# Patient Record
Sex: Female | Born: 1985 | Race: Black or African American | Hispanic: No | Marital: Married | Smoking: Never smoker
Health system: Southern US, Community
[De-identification: ages and names within clinical notes are randomized; demographics above are authoritative.]

---

## 2020-11-06 ENCOUNTER — Encounter: Payer: Self-pay | Admitting: *Deleted

## 2020-11-06 ENCOUNTER — Emergency Department
Admission: EM | Admit: 2020-11-06 | Discharge: 2020-11-07 | Disposition: A | Discharge status: Home / Self Care | Payer: 59 | Attending: Emergency Medicine | Admitting: Emergency Medicine

## 2020-11-06 ENCOUNTER — Other Ambulatory Visit: Payer: Self-pay

## 2020-11-06 ENCOUNTER — Emergency Department: Payer: 59

## 2020-11-06 DIAGNOSIS — R58 Hemorrhage, not elsewhere classified: Secondary | ICD-10-CM

## 2020-11-06 DIAGNOSIS — Z3A12 12 weeks gestation of pregnancy: Secondary | ICD-10-CM | POA: Diagnosis not present

## 2020-11-06 DIAGNOSIS — O208 Other hemorrhage in early pregnancy: Secondary | ICD-10-CM | POA: Diagnosis present

## 2020-11-06 DIAGNOSIS — N9489 Other specified conditions associated with female genital organs and menstrual cycle: Secondary | ICD-10-CM | POA: Insufficient documentation

## 2020-11-06 DIAGNOSIS — R8271 Bacteriuria: Secondary | ICD-10-CM | POA: Insufficient documentation

## 2020-11-06 DIAGNOSIS — O209 Hemorrhage in early pregnancy, unspecified: Secondary | ICD-10-CM

## 2020-11-06 LAB — CBC
HCT: 34.3 % — ABNORMAL LOW (ref 36.0–46.0)
Hemoglobin: 11.6 g/dL — ABNORMAL LOW (ref 12.0–15.0)
MCH: 29.2 pg (ref 26.0–34.0)
MCHC: 33.8 g/dL (ref 30.0–36.0)
MCV: 86.4 fL (ref 80.0–100.0)
Platelets: 268 10*3/uL (ref 150–400)
RBC: 3.97 MIL/uL (ref 3.87–5.11)
RDW: 13 % (ref 11.5–15.5)
WBC: 5.6 10*3/uL (ref 4.0–10.5)
nRBC: 0 % (ref 0.0–0.2)

## 2020-11-06 LAB — URINALYSIS, COMPLETE (UACMP) WITH MICROSCOPIC
Bilirubin Urine: NEGATIVE
Glucose, UA: NEGATIVE mg/dL
Ketones, ur: NEGATIVE mg/dL
Nitrite: NEGATIVE
Protein, ur: NEGATIVE mg/dL
Specific Gravity, Urine: 1.006 (ref 1.005–1.030)
pH: 7 (ref 5.0–8.0)

## 2020-11-06 LAB — ABO/RH: ABO/RH(D): O POS

## 2020-11-06 LAB — HCG, QUANTITATIVE, PREGNANCY: hCG, Beta Chain, Quant, S: 97347 m[IU]/mL — ABNORMAL HIGH (ref <5)

## 2020-11-06 NOTE — ED Provider Notes (Signed)
[facility] Provider Note  ____________________________________________   Event Date/Time   First MD Initiated Contact with Patient 11/06/20 2325     (approximate)  I have reviewed the triage vital signs and the nursing notes.   HISTORY  Chief Complaint Vaginal Bleeding   HPI Analyssa [facility] is a 35 y.o. female G4P3 without other significant past medical history approximately [redacted] weeks pregnant by last LMP on 5/20 who presents for assessment of an episode of painless vaginal bleeding she had today.  Patient states he was using the restroom and wiped her vagina and noticed little blood on the tissue but then the next time she wiped there was no blood.  She has not been passing any clots or fetal tissue.  She denies any abdominal pain, back pain, fevers, diarrhea, constipation recent injuries or falls.  No headache, earache, sore throat, chest pain, cough, shortness of breath or recent injuries.  No other acute concerns at this time.  She states she is not have establish care with her OB/GYN has appointment in a couple weeks.  She did take a home pregnancy test that was positive and confirmed by her PCP.         History reviewed. No pertinent past medical history.  There are no problems to display for this patient.   History reviewed. No pertinent surgical history.  Prior to Admission medications   Medication Sig Start Date End Date Taking? Authorizing Provider  cephALEXin (KEFLEX) 500 MG capsule Take 1 capsule (500 mg total) by mouth 4 (four) times daily for 7 days. 11/07/20 11/14/20 Yes Lucrezia Starch, MD    Allergies Tramadol  No family history on file.  Social History    Review of Systems  Review of Systems  Constitutional:  Negative for chills and fever.  HENT:  Negative for sore throat.   Eyes:  Negative for pain.  Respiratory:  Negative for cough and stridor.   Cardiovascular:  Negative for chest pain.   Gastrointestinal:  Negative for vomiting.  Genitourinary:  Negative for dysuria.  Musculoskeletal:  Negative for myalgias.  Skin:  Negative for rash.  Neurological:  Negative for seizures, loss of consciousness and headaches.  Psychiatric/Behavioral:  Negative for suicidal ideas.   All other systems reviewed and are negative.    ____________________________________________   PHYSICAL EXAM:  VITAL SIGNS: ED Triage Vitals  Enc Vitals Group     BP 11/06/20 2033 120/80     Pulse Rate 11/06/20 2033 84     Resp 11/06/20 2033 16     Temp 11/06/20 2033 98.8 F (37.1 C)     Temp Source 11/06/20 2033 Oral     SpO2 11/06/20 2033 100 %     Weight 11/06/20 2033 172 lb (78 kg)     Height 11/06/20 2033 '5\' 4"'$  (1.626 m)     Head Circumference --      Peak Flow --      Pain Score 11/06/20 2035 0     Pain Loc --      Pain Edu? --      Excl. in [place]? --    Vitals:   11/06/20 2354 11/07/20 0000  BP: 130/69 126/77  Pulse: 80 75  Resp: 16   Temp:    SpO2: 100% 100%   Physical Exam Vitals and nursing note reviewed.  Constitutional:      General: She is not in acute distress.    Appearance: She is well-developed.  HENT:  Head: Normocephalic and atraumatic.     Right Ear: External ear normal.     Left Ear: External ear normal.     Nose: Nose normal.     Mouth/Throat:     Mouth: Mucous membranes are moist.  Eyes:     Conjunctiva/sclera: Conjunctivae normal.  Cardiovascular:     Rate and Rhythm: Normal rate and regular rhythm.     Pulses: Normal pulses.     Heart sounds: No murmur heard. Pulmonary:     Effort: Pulmonary effort is normal. No respiratory distress.     Breath sounds: Normal breath sounds.  Abdominal:     Palpations: Abdomen is soft.     Tenderness: There is no abdominal tenderness.  Musculoskeletal:     Cervical back: Neck supple.  Skin:    General: Skin is warm and dry.     Capillary Refill: Capillary refill takes less than 2 seconds.  Neurological:      Mental Status: She is alert and oriented to person, place, and time.  Psychiatric:        Mood and Affect: Mood normal.     ____________________________________________   LABS (all labs ordered are listed, but only abnormal results are displayed)  Labs Reviewed  HCG, QUANTITATIVE, PREGNANCY - Abnormal; Notable for the following components:      Result Value   hCG, Beta Chain, Quant, S 97,347 (*)    All other components within normal limits  CBC - Abnormal; Notable for the following components:   Hemoglobin 11.6 (*)    HCT 34.3 (*)    All other components within normal limits  URINALYSIS, COMPLETE (UACMP) WITH MICROSCOPIC - Abnormal; Notable for the following components:   Color, Urine STRAW (*)    APPearance CLEAR (*)    Hgb urine dipstick MODERATE (*)    Leukocytes,Ua MODERATE (*)    Bacteria, UA FEW (*)    All other components within normal limits  URINE CULTURE  ABO/RH   ____________________________________________  EKG  ____________________________________________  RADIOLOGY  ED MD interpretation: Pelvic ultrasound shows single IUP at 12 weeks 3 days by crown-rump length.  Small uterine fibroid without any other acute abnormalities noted.  Probable corpus luteum cyst on the right.  No other discernible adnexal masses  Official radiology report(s): US OB Comp Less 14 Wks  Result Date: 11/06/2020 CLINICAL DATA:  Vaginal bleeding for 1 day, quantitative beta HCG 97,347 EXAM: OBSTETRIC <14 WK ULTRASOUND TECHNIQUE: Transabdominal ultrasound was performed for evaluation of the gestation as well as the maternal uterus and adnexal regions. COMPARISON:  None. FINDINGS: Intrauterine gestational sac: Single Yolk sac:  Not Visualized. Embryo:  Visualized. Cardiac Activity: Visualized. Heart Rate: 160 bpm CRL:   60 mm   12 w 3 d                  [place] EDC: 05/18/2021 Subchorionic hemorrhage:  None. Maternal uterus/adnexae: Anteverted maternal uterus. Hypoechoic structure along the  posterior uterine body measuring 1.8 x 1.4 x 1.6 cm, possibly a uterine fibroid. Probable corpus luteum in the right ovary measuring 2.4 x 2.0 x 2.1 cm. No concerning adnexal masses. IMPRESSION: 1. Single intrauterine gestation at 12 weeks, 3 days by crown-rump length sonographic estimation. 2. Nonvisualization of the yolk sac is a typical physiologic finding after 10 weeks of gestation. 3. Suspect a small uterine fibroid measuring 1.8 cm along the posterior uterine body. 4. No other acute or significant sonographic abnormality. Electronically Signed   By: Elwin Sleight.D.  On: 11/06/2020 23:28    ____________________________________________   PROCEDURES  Procedure(s) performed (including Critical Care):  Procedures   ____________________________________________   INITIAL IMPRESSION / ASSESSMENT AND PLAN / ED COURSE      Patient presents for assessment of an episode of painless vaginal bleeding in the setting of first trimester pregnancy that she states seem to have resolved and she has not had any subsequent bleeding.  No associated sick symptoms.  She has not yet established care with OB or had imaging this pregnancy.  On arrival she is afebrile hemodynamically stable.  Her abdomen is soft nontender and she has no CVA tenderness.  Differential includes possible threatened miscarriage, subchorionic hemorrhage placenta previa.  Pelvic ultrasound shows single IUP at 12 weeks 3 days by crown-rump length.  Small uterine fibroid without any other acute abnormalities noted.  Probable corpus luteum cyst on the right.  No other discernible adnexal masses  hCG is 97,347.  CBC shows no leukocytosis and hemoglobin of 11.6.  Patient is Rh+ is indication for RhoGAM at this point.  UA remarkable for moderate leukocyte esterase and some bacteria.  Overall unclear etiology for bleeding although low suspicion for abruption and no findings on ultrasound to suggest previa or significant subchorionic  hemorrhage or other significant abnormality at this time.  Patient has stable vitals and hemoglobin is acceptable and there is no indication for transfusion and she denies any recurrence of bleeding or anemia symptoms at this time.  While she denies any urinary symptoms and has no fever back pain we will treat her asymptomatic bacteria with Keflex.  Advised her to follow-up with OB/GYN.  Discharge stable condition.  Strict return precautions advised and discussed.      ____________________________________________   FINAL CLINICAL IMPRESSION(S) / ED DIAGNOSES  Final diagnoses:  Bleeding  First trimester bleeding  Asymptomatic bacteriuria    Medications - No data to display   ED Discharge Orders          Ordered    cephALEXin (KEFLEX) 500 MG capsule  4 times daily        11/07/20 0012             Note:  This document was prepared using Dragon voice recognition software and may include unintentional dictation errors.    Lucrezia Starch, MD 11/07/20 [phone]

## 2020-11-06 NOTE — ED Triage Notes (Signed)
LMP 5/20, confirmed pregnancy test. Reports when she wiped today she had blood on the tissue x 1. No urinary symptoms. No pain.

## 2020-11-07 MED ORDER — CEPHALEXIN 500 MG PO CAPS: 500.0000 mg | ORAL_CAPSULE | Freq: Four times a day (QID) | ORAL | 0 refills | Status: AC

## 2020-11-07 NOTE — ED Notes (Signed)
MD Charlann Boxer notified of pelvic cart/ supplies at bedside

## 2020-11-07 NOTE — Discharge Instructions (Addendum)
Your Ultrasound today showed: 1. Single intrauterine gestation at 12 weeks, 3 days by crown-rump length sonographic estimation. 2. Nonvisualization of the yolk sac is a typical physiologic finding after 10 weeks of gestation. 3. Suspect a small uterine fibroid measuring 1.8 cm along the posterior uterine body. 4. No other acute or significant sonographic abnormality.

## 2020-11-08 LAB — URINE CULTURE

## 2020-11-24 ENCOUNTER — Other Ambulatory Visit: Payer: Self-pay

## 2020-11-24 ENCOUNTER — Emergency Department
Admission: EM | Admit: 2020-11-24 | Discharge: 2020-11-24 | Disposition: A | Discharge status: Home / Self Care | Payer: 59 | Attending: Emergency Medicine | Admitting: Emergency Medicine

## 2020-11-24 DIAGNOSIS — U071 COVID-19: Secondary | ICD-10-CM | POA: Diagnosis not present

## 2020-11-24 DIAGNOSIS — Z3A15 15 weeks gestation of pregnancy: Secondary | ICD-10-CM | POA: Diagnosis not present

## 2020-11-24 DIAGNOSIS — J029 Acute pharyngitis, unspecified: Secondary | ICD-10-CM

## 2020-11-24 DIAGNOSIS — O98512 Other viral diseases complicating pregnancy, second trimester: Secondary | ICD-10-CM | POA: Diagnosis not present

## 2020-11-24 LAB — GROUP A STREP BY PCR: Group A Strep by PCR: NOT DETECTED

## 2020-11-24 LAB — SARS CORONAVIRUS 2 (TAT 6-24 HRS): SARS Coronavirus 2: POSITIVE — AB

## 2020-11-24 MED ORDER — LIDOCAINE VISCOUS HCL 2 % MT SOLN: 5.0000 mL | Freq: Four times a day (QID) | OROMUCOSAL | 0 refills | Status: DC | PRN

## 2020-11-24 MED ORDER — ONDANSETRON 8 MG PO TBDP
8.0000 mg | ORAL_TABLET | Freq: Once | ORAL | Status: AC
  Administered 2020-11-24: 8 mg via ORAL
  Filled 2020-11-24: qty 1

## 2020-11-24 MED ORDER — LIDOCAINE VISCOUS HCL 2 % MT SOLN
5.0000 mL | Freq: Once | OROMUCOSAL | Status: AC
  Administered 2020-11-24: 5 mL via OROMUCOSAL
  Filled 2020-11-24: qty 15

## 2020-11-24 MED ORDER — ACETAMINOPHEN 325 MG PO TABS
650.0000 mg | ORAL_TABLET | Freq: Once | ORAL | Status: AC
  Administered 2020-11-24: 650 mg via ORAL
  Filled 2020-11-24: qty 2

## 2020-11-24 NOTE — ED Triage Notes (Signed)
Pt comes pov with sore throat, fever, runny nose for 3 days. States hasn't been able to eat anything. [redacted] weeks pregnant.

## 2020-11-24 NOTE — ED Provider Notes (Signed)
[facility] Emergency Department Provider Note   ____________________________________________   Event Date/Time   First MD Initiated Contact with Patient 11/24/20 [phone]     (approximate)  I have reviewed the triage vital signs and the nursing notes.   HISTORY  Chief Complaint Fever and Sore Throat    HPI [facility] is a 35 y.o. female patient complain of sore throat fever, body aches for 3 days.  Patient denies recent travel or known contact with COVID-19.  Patient is taken vaccines include boosters.  Patient is [redacted] weeks gestation.         History reviewed. No pertinent past medical history.  There are no problems to display for this patient.   History reviewed. No pertinent surgical history.  Prior to Admission medications   Medication Sig Start Date End Date Taking? Authorizing Provider  lidocaine (XYLOCAINE) 2 % solution Use as directed 5 mLs in the mouth or throat every 6 (six) hours as needed for mouth pain. For gargle and oral swish. 11/24/20  Yes Sable Feil, PA-C    Allergies Tramadol  History reviewed. No pertinent family history.  Social History    Review of Systems Constitutional: Fever/chills and body aches. Eyes: No visual changes. ENT: Sore throat. Cardiovascular: Denies chest pain. Respiratory: Denies shortness of breath. Gastrointestinal: No abdominal pain.  No nausea, no vomiting.  No diarrhea.  No constipation. Genitourinary: Negative for dysuria. Musculoskeletal: Negative for back pain. Skin: Negative for rash. Neurological: Negative for headaches, focal weakness or numbness. Allergic/Immunilogical: Tramadol ____________________________________________   PHYSICAL EXAM:  VITAL SIGNS: ED Triage Vitals  Enc Vitals Group     BP 11/24/20 0917 113/74     Pulse Rate 11/24/20 0917 (!) 102     Resp 11/24/20 0917 18     Temp 11/24/20 0917 98.7 F (37.1 C)     Temp Source 11/24/20 0917 Oral     SpO2  11/24/20 0917 100 %     Weight 11/24/20 0918 173 lb (78.5 kg)     Height 11/24/20 0918 '5\' 4"'$  (1.626 m)     Head Circumference --      Peak Flow --      Pain Score 11/24/20 0917 7     Pain Loc --      Pain Edu? --      Excl. in [place]? --     Constitutional: Alert and oriented. Well appearing and in no acute distress. Eyes: Conjunctivae are normal. PERRL. EOMI. Head: Atraumatic. Nose: No congestion/rhinnorhea. Mouth/Throat: Mucous membranes are moist.  Oropharynx non-erythematous. Neck: No stridor.  No cervical spine tenderness to palpation. Hematological/Lymphatic/Immunilogical: No cervical lymphadenopathy. Cardiovascular: Normal rate, regular rhythm. Grossly normal heart sounds.  Good peripheral circulation. Respiratory: Normal respiratory effort.  No retractions. Lungs CTAB. Gastrointestinal: Soft and nontender. No distention. No abdominal bruits. No CVA tenderness. Genitourinary: Deferred Musculoskeletal: No lower extremity tenderness nor edema.  No joint effusions. Neurologic:  Normal speech and language. No gross focal neurologic deficits are appreciated. No gait instability. Skin:  Skin is warm, dry and intact. No rash noted. Psychiatric: Mood and affect are normal. Speech and behavior are normal.  ____________________________________________   LABS (all labs ordered are listed, but only abnormal results are displayed)  Labs Reviewed  GROUP A STREP BY PCR  SARS CORONAVIRUS 2 (TAT 6-24 HRS)   ____________________________________________  EKG   ____________________________________________  RADIOLOGY Cecilio Asper, personally viewed and evaluated these images (plain radiographs) as part of my medical decision making,  as well as reviewing the written report by the radiologist.  ED MD interpretation:    Official radiology report(s): No results found.  ____________________________________________   PROCEDURES  Procedure(s) performed (including Critical  Care):  Procedures   ____________________________________________   INITIAL IMPRESSION / ASSESSMENT AND PLAN / ED COURSE  As part of my medical decision making, I reviewed the following data within the [place]         Patient presents with fever and sore throat and runny nose for 3 days.  Patient states she has not been able to eat anything secondary to the sore throat.  Patient rapid strep test was negative.  Patient was given viscous lidocaine for follow-up gargle.  Patient given Zofran and was able to tolerate     ____________________________________________   FINAL CLINICAL IMPRESSION(S) / ED DIAGNOSES  Final diagnoses:  Viral pharyngitis     ED Discharge Orders          Ordered    lidocaine (XYLOCAINE) 2 % solution  Every 6 hours PRN        11/24/20 1112             Note:  This document was prepared using Dragon voice recognition software and may include unintentional dictation errors.    Sable Feil, PA-C 11/26/20 1054    Vladimir Crofts, MD 11/26/20 [phone]

## 2020-11-24 NOTE — Discharge Instructions (Addendum)
Read and follow discharge care instruction.  Use viscous lidocaine oral swish/gargle as needed for sore throat.

## 2020-11-26 DIAGNOSIS — D509 Iron deficiency anemia, unspecified: Secondary | ICD-10-CM | POA: Insufficient documentation

## 2020-11-26 DIAGNOSIS — Z8619 Personal history of other infectious and parasitic diseases: Secondary | ICD-10-CM | POA: Insufficient documentation

## 2020-11-26 DIAGNOSIS — Z8742 Personal history of other diseases of the female genital tract: Secondary | ICD-10-CM | POA: Insufficient documentation

## 2020-11-26 DIAGNOSIS — O09892 Supervision of other high risk pregnancies, second trimester: Secondary | ICD-10-CM | POA: Insufficient documentation

## 2020-11-26 DIAGNOSIS — O34219 Maternal care for unspecified type scar from previous cesarean delivery: Secondary | ICD-10-CM | POA: Insufficient documentation

## 2020-11-26 DIAGNOSIS — O0992 Supervision of high risk pregnancy, unspecified, second trimester: Secondary | ICD-10-CM | POA: Insufficient documentation

## 2021-03-01 DIAGNOSIS — D259 Leiomyoma of uterus, unspecified: Secondary | ICD-10-CM | POA: Insufficient documentation

## 2021-03-04 DIAGNOSIS — Z302 Encounter for sterilization: Secondary | ICD-10-CM | POA: Insufficient documentation

## 2021-09-14 ENCOUNTER — Emergency Department
Admission: EM | Admit: 2021-09-14 | Discharge: 2021-09-15 | Disposition: A | Discharge status: Home / Self Care | Payer: 59 | Attending: Emergency Medicine | Admitting: Emergency Medicine

## 2021-09-14 ENCOUNTER — Other Ambulatory Visit: Payer: Self-pay

## 2021-09-14 ENCOUNTER — Encounter: Payer: Self-pay | Admitting: Radiology

## 2021-09-14 ENCOUNTER — Emergency Department: Payer: 59

## 2021-09-14 DIAGNOSIS — N3000 Acute cystitis without hematuria: Secondary | ICD-10-CM | POA: Insufficient documentation

## 2021-09-14 DIAGNOSIS — R002 Palpitations: Secondary | ICD-10-CM | POA: Insufficient documentation

## 2021-09-14 DIAGNOSIS — R1030 Lower abdominal pain, unspecified: Secondary | ICD-10-CM | POA: Diagnosis present

## 2021-09-14 DIAGNOSIS — R42 Dizziness and giddiness: Secondary | ICD-10-CM | POA: Diagnosis not present

## 2021-09-14 LAB — CBC
HCT: 38.6 % (ref 36.0–46.0)
Hemoglobin: 12 g/dL (ref 12.0–15.0)
MCH: 27.5 pg (ref 26.0–34.0)
MCHC: 31.1 g/dL (ref 30.0–36.0)
MCV: 88.5 fL (ref 80.0–100.0)
Platelets: 293 10*3/uL (ref 150–400)
RBC: 4.36 MIL/uL (ref 3.87–5.11)
RDW: 12.9 % (ref 11.5–15.5)
WBC: 5.4 10*3/uL (ref 4.0–10.5)
nRBC: 0 % (ref 0.0–0.2)

## 2021-09-14 LAB — COMPREHENSIVE METABOLIC PANEL
ALT: 26 U/L (ref 0–44)
AST: 26 U/L (ref 15–41)
Albumin: 4.2 g/dL (ref 3.5–5.0)
Alkaline Phosphatase: 83 U/L (ref 38–126)
Anion gap: 5 (ref 5–15)
BUN: 12 mg/dL (ref 6–20)
CO2: 27 mmol/L (ref 22–32)
Calcium: 9.1 mg/dL (ref 8.9–10.3)
Chloride: 107 mmol/L (ref 98–111)
Creatinine, Ser: 0.98 mg/dL (ref 0.44–1.00)
GFR, Estimated: >60 mL/min (ref >60)
Glucose, Bld: 97 mg/dL (ref 70–99)
Potassium: 3.6 mmol/L (ref 3.5–5.1)
Sodium: 139 mmol/L (ref 135–145)
Total Bilirubin: 0.6 mg/dL (ref 0.3–1.2)
Total Protein: 8.6 g/dL — ABNORMAL HIGH (ref 6.5–8.1)

## 2021-09-14 LAB — URINALYSIS, ROUTINE W REFLEX MICROSCOPIC
Bilirubin Urine: NEGATIVE
Glucose, UA: NEGATIVE mg/dL
Hgb urine dipstick: NEGATIVE
Ketones, ur: 5 mg/dL — AB
Nitrite: NEGATIVE
Protein, ur: 30 mg/dL — AB
Specific Gravity, Urine: 1.026 (ref 1.005–1.030)
pH: 6 (ref 5.0–8.0)

## 2021-09-14 LAB — LIPASE, BLOOD: Lipase: 46 U/L (ref 11–51)

## 2021-09-14 LAB — POC URINE PREG, ED: Preg Test, Ur: NEGATIVE

## 2021-09-14 LAB — TROPONIN I (HIGH SENSITIVITY): Troponin I (High Sensitivity): 4 ng/L (ref <18)

## 2021-09-14 MED ORDER — NITROFURANTOIN MONOHYD MACRO 100 MG PO CAPS
100.0000 mg | ORAL_CAPSULE | Freq: Once | ORAL | Status: AC
  Administered 2021-09-14: 100 mg via ORAL
  Filled 2021-09-14: qty 1

## 2021-09-14 NOTE — ED Triage Notes (Signed)
Pt ambulatory to triage with steady gait with c/o Headache, dizziness, abd pain, heart palpitations x 3 weeks. Pt reports no abrupt or acute change in sx to make her want to be seen tonight, she is just concerned about the length of time sx have occurred. Reports abd pain is concentrated to pelvic region, and she isnt sure if it is because period may be coming. States dizziness occurs several times each day.  Pt sitting comfortably in triage unassisted with no distress noted.

## 2021-09-15 LAB — TSH: TSH: 2.464 u[IU]/mL (ref 0.350–4.500)

## 2021-09-15 LAB — T4, FREE: Free T4: 0.79 ng/dL (ref 0.61–1.12)

## 2021-09-15 MED ORDER — NITROFURANTOIN MONOHYD MACRO 100 MG PO CAPS: 100.0000 mg | ORAL_CAPSULE | Freq: Two times a day (BID) | ORAL | 0 refills | Status: AC

## 2021-09-15 NOTE — ED Provider Notes (Signed)
[facility] Provider Note    Event Date/Time   First MD Initiated Contact with Patient 09/14/21 2300     (approximate)   History   Abdominal Pain   HPI  [facility] is a 36 y.o. female no significant past medical history who presents for evaluation of several medical complaints.  Patient reports that over the last month she has been having episodes of dizziness where she feels lightheaded.  These episodes can happen at random times.  She usually has them a couple times a week.  She also has had episodes where she feels like her heart is fluttering in her chest.  She has these episodes also couple times a week.  Over the last 2 weeks she has had suprapubic cramping abdominal pain that seems to be worse with urination.  She denies flank pain, nausea, vomiting, diarrhea, constipation, fever or chills, chest pain, shortness of breath.  Patient reports that she has been under a lot of stress since having a baby 3 months ago and is trying to adapt to life with the child.  She denies any depression.     History reviewed. No pertinent past medical history.  No past surgical history on file.   Physical Exam   Triage Vital Signs: ED Triage Vitals [09/14/21 2007]  Enc Vitals Group     BP (!) 152/87     Pulse Rate 80     Resp 16     Temp 99.5 F (37.5 C)     Temp Source Oral     SpO2 99 %     Weight 174 lb (78.9 kg)     Height '5\' 4"'$  (1.626 m)     Head Circumference      Peak Flow      Pain Score 5     Pain Loc      Pain Edu?      Excl. in [place]?     Most recent vital signs: Vitals:   09/15/21 0030 09/15/21 0045  BP:    Pulse: (!) 59 60  Resp: 18 16  Temp:    SpO2: 100% 100%     Constitutional: Alert and oriented. Well appearing and in no apparent distress. HEENT:      Head: Normocephalic and atraumatic.         Eyes: Conjunctivae are normal. Sclera is non-icteric.       Mouth/Throat: Mucous membranes are moist.       Neck: Supple with no  signs of meningismus. Cardiovascular: Regular rate and rhythm. No murmurs, gallops, or rubs. 2+ symmetrical distal pulses are present in all extremities.  Respiratory: Normal respiratory effort. Lungs are clear to auscultation bilaterally.  Gastrointestinal: Soft, non tender, and non distended with positive bowel sounds. No rebound or guarding. Genitourinary: No CVA tenderness. Musculoskeletal:  No edema, cyanosis, or erythema of extremities. Neurologic: Normal speech and language. Face is symmetric. Moving all extremities. No gross focal neurologic deficits are appreciated. Skin: Skin is warm, dry and intact. No rash noted. Psychiatric: Mood and affect are normal. Speech and behavior are normal.  ED Results / Procedures / Treatments   Labs (all labs ordered are listed, but only abnormal results are displayed) Labs Reviewed  COMPREHENSIVE METABOLIC PANEL - Abnormal; Notable for the following components:      Result Value   Total Protein 8.6 (*)    All other components within normal limits  URINALYSIS, ROUTINE W REFLEX MICROSCOPIC - Abnormal; Notable for the following components:  Color, Urine YELLOW (*)    APPearance CLOUDY (*)    Ketones, ur 5 (*)    Protein, ur 30 (*)    Leukocytes,Ua TRACE (*)    Bacteria, UA RARE (*)    All other components within normal limits  URINE CULTURE  LIPASE, BLOOD  CBC  TSH  T4, FREE  POC URINE PREG, ED  TROPONIN I (HIGH SENSITIVITY)     EKG  ED ECG REPORT I, Rudene Re, the attending physician, personally viewed and interpreted this ECG.  Normal sinus rhythm, normal intervals, normal axis, no STE or depressions, no evidence of HOCM, AV block, delta wave, ARVD, prolonged QTc, WPW, or B[place].   RADIOLOGY I, Rudene Re, attending MD, have personally viewed and interpreted the images obtained during this visit as below:  KUB negative   ___________________________________________________ Interpretation by Radiologist:  DG  Abdomen 1 View  Result Date: 09/14/2021 CLINICAL DATA:  abd pain, constipation EXAM: ABDOMEN - 1 VIEW COMPARISON:  None Available. FINDINGS: The bowel gas pattern is normal. No radio-opaque calculi or other significant radiographic abnormality are seen. IMPRESSION: Negative. Electronically Signed   By: Iven Finn M.D.   On: 09/14/2021 23:59       PROCEDURES:  Critical Care performed: No  Procedures    IMPRESSION / MDM / ASSESSMENT AND PLAN / ED COURSE  I reviewed the triage vital signs and the nursing notes.  36 y.o. female no significant past medical history who presents for evaluation of several medical complaints.  # dizzy spells: happening at random times. Ddx vertigo, dehydration, anemia, pregnancy, dysrhythmia anxiety, stress  #Fluttering: Anxiety versus dysrhythmia  # suprapubic abd pain: UTI versus STD versus constipation versus pregnancy.  Doubt any surgical etiology since patient has been having symptoms for at least 2 weeks and patient does not have any significant tenderness on palpation    MEDICATIONS GIVEN IN ED: Medications  nitrofurantoin (macrocrystal-monohydrate) (MACROBID) capsule 100 mg (100 mg Oral Given 09/14/21 2339)     ED COURSE: KUB with no signs of severe constipation.  Pregnancy test is negative.  UA is positive for UTI which will be treated with nitrofurantoin.  No signs of sepsis.  We discussed pelvic exam to rule out STD but patient says that she has no concerns about that and declined the test.  Metabolic panel is normal with no electrolyte derangements, no signs of anemia or leukocytosis.  LFTs and lipase are within normal limits.  Thyroid studies are negative for any acute findings.  Patient was monitored on telemetry for 1.5 hours with no signs of dysrhythmias.  Patient remains asymptomatic at this time with no abdominal pain, no fluttering, no dizzy spells.  Will refer to cardiology for Holter monitoring.  Recommended close follow-up with  primary care doctor and discussed my standard return precautions.   Consults: None   EMR reviewed including records from her last admission to the hospital in January 2023    FINAL CLINICAL IMPRESSION(S) / ED DIAGNOSES   Final diagnoses:  Palpitations  Acute cystitis without hematuria     Rx / DC Orders   ED Discharge Orders          Ordered    nitrofurantoin, macrocrystal-monohydrate, (MACROBID) 100 MG capsule  2 times daily        09/15/21 0051    Ambulatory referral to Cardiology       Comments: Fluttering and dizzy spells   09/15/21 0051  Note:  This document was prepared using Dragon voice recognition software and may include unintentional dictation errors.   Please note:  Patient was evaluated in Emergency Department today for the symptoms described in the history of present illness. Patient was evaluated in the context of the global COVID-19 pandemic, which necessitated consideration that the patient might be at risk for infection with the SARS-CoV-2 virus that causes COVID-19. Institutional protocols and algorithms that pertain to the evaluation of patients at risk for COVID-19 are in a state of rapid change based on information released by regulatory bodies including the CDC and federal and state organizations. These policies and algorithms were followed during the patient's care in the ED.  Some ED evaluations and interventions may be delayed as a result of limited staffing during the pandemic.       Alfred Levins, [place], MD 09/15/21 [phone]

## 2021-09-16 LAB — URINE CULTURE: Culture: 80000 — AB

## 2021-10-02 ENCOUNTER — Ambulatory Visit: Payer: 59 | Admitting: Cardiovascular Disease

## 2021-10-14 ENCOUNTER — Encounter: Payer: Self-pay | Admitting: *Deleted

## 2021-10-23 ENCOUNTER — Ambulatory Visit: Payer: 59 | Admitting: Cardiovascular Disease

## 2021-11-08 ENCOUNTER — Encounter: Payer: Self-pay | Admitting: Emergency Medicine

## 2021-11-08 ENCOUNTER — Ambulatory Visit
Admission: EM | Admit: 2021-11-08 | Discharge: 2021-11-08 | Disposition: A | Discharge status: Home / Self Care | Payer: 59 | Attending: Emergency Medicine | Admitting: Emergency Medicine

## 2021-11-08 DIAGNOSIS — B349 Viral infection, unspecified: Secondary | ICD-10-CM | POA: Diagnosis not present

## 2021-11-08 LAB — POCT RAPID STREP A (OFFICE): Rapid Strep A Screen: NEGATIVE

## 2021-11-08 MED ORDER — IBUPROFEN 600 MG PO TABS: 600.0000 mg | ORAL_TABLET | Freq: Four times a day (QID) | ORAL | 0 refills | Status: AC | PRN

## 2021-11-08 MED ORDER — LIDOCAINE VISCOUS HCL 2 % MT SOLN: 15.0000 mL | OROMUCOSAL | 0 refills | Status: AC | PRN

## 2021-11-08 NOTE — Discharge Instructions (Addendum)
Your COVID and Flu tests are pending.     Take the ibuprofen as needed for fever or discomfort.  Use the viscous lidocaine as needed for sore throat.  Rest and keep yourself hydrated.    Follow-up with your primary care provider if your symptoms are not improving.

## 2021-11-08 NOTE — ED Provider Notes (Signed)
[name]    CSN: [id number] Arrival date & time: 11/08/21  1802      History   Chief Complaint Chief Complaint  Patient presents with   Sore Throat   Nasal Congestion   Generalized Body Aches   Fever   Headache    HPI Linda Hurst is a 36 y.o. female.  Accompanied by her husband, patient presents with 3-day history of body aches, sore throat, congestion.  She reports chills and stomach ache today.  No fever, rash, cough, shortness of breath, vomiting, diarrhea, dysuria, or other symptoms.  Last bowel movement today.  Treatment attempted with Tylenol.  No pertinent medical history.  Patient denies pregnancy or breast-feeding.    The history is provided by the patient.    History reviewed. No pertinent past medical history.  There are no problems to display for this patient.   History reviewed. No pertinent surgical history.  OB History     Gravida  1   Para      Term      Preterm      AB      Living         SAB      IAB      Ectopic      Multiple      Live Births               Home Medications    Prior to Admission medications   Medication Sig Start Date End Date Taking? Authorizing Provider  ibuprofen (ADVIL) 600 MG tablet Take 1 tablet (600 mg total) by mouth every 6 (six) hours as needed. 11/08/21  Yes Sharion Balloon, NP  lidocaine (XYLOCAINE) 2 % solution Use as directed 15 mLs in the mouth or throat as needed for mouth pain. 11/08/21  Yes Sharion Balloon, NP  cyclobenzaprine (FLEXERIL) 10 MG tablet Take 5-10 mg by mouth at bedtime as needed. 08/16/21   [provider]  meloxicam (MOBIC) 15 MG tablet Take 15 mg by mouth daily. 08/16/21   [provider]    Family History Family History  Problem Relation Age of Onset   Healthy Mother    Healthy Father     Social History Social History   Tobacco Use   Smoking status: Never   Smokeless tobacco: Never  Substance Use Topics   Alcohol use: Yes      Allergies   Tramadol   Review of Systems Review of Systems  Constitutional:  Positive for chills. Negative for fever.  HENT:  Positive for congestion and sore throat. Negative for ear pain.   Respiratory:  Negative for cough and shortness of breath.   Gastrointestinal:  Negative for diarrhea and vomiting.  Skin:  Negative for color change and rash.  All other systems reviewed and are negative.    Physical Exam Triage Vital Signs ED Triage Vitals  Enc Vitals Group     BP      Pulse      Resp      Temp      Temp src      SpO2      Weight      Height      Head Circumference      Peak Flow      Pain Score      Pain Loc      Pain Edu?      Excl. in [place]?    No data found.  Updated  Vital Signs BP 129/84 (BP Location: Left Arm)   Pulse 84   Temp 98.8 F (37.1 C) (Oral)   Resp 16   Ht '5\' 4"'$  (1.626 m)   Wt 170 lb (77.1 kg)   LMP 10/21/2021 (Exact Date)   SpO2 98%   BMI 29.18 kg/m   Visual Acuity Right Eye Distance:   Left Eye Distance:   Bilateral Distance:    Right Eye Near:   Left Eye Near:    Bilateral Near:     Physical Exam Vitals and nursing note reviewed.  Constitutional:      General: She is not in acute distress.    Appearance: Normal appearance. She is well-developed. She is not ill-appearing.  HENT:     Right Ear: Tympanic membrane normal.     Left Ear: Tympanic membrane normal.     Nose: Nose normal.     Mouth/Throat:     Mouth: Mucous membranes are moist.     Pharynx: Oropharynx is clear.  Cardiovascular:     Rate and Rhythm: Normal rate and regular rhythm.     Heart sounds: Normal heart sounds.  Pulmonary:     Effort: Pulmonary effort is normal. No respiratory distress.     Breath sounds: Normal breath sounds.  Abdominal:     General: Bowel sounds are normal.     Palpations: Abdomen is soft.     Tenderness: There is no abdominal tenderness. There is no guarding or rebound.  Musculoskeletal:     Cervical back: Neck supple.   Skin:    General: Skin is warm and dry.  Neurological:     Mental Status: She is alert.  Psychiatric:        Mood and Affect: Mood normal.        Behavior: Behavior normal.      UC Treatments / Results  Labs (all labs ordered are listed, but only abnormal results are displayed) Labs Reviewed  POCT RAPID STREP A (OFFICE) - Normal  COVID-19, FLU A+B NAA    EKG   Radiology No results found.  Procedures Procedures (including critical care time)  Medications Ordered in UC Medications - No data to display  Initial Impression / Assessment and Plan / UC Course  I have reviewed the triage vital signs and the nursing notes.  Pertinent labs & imaging results that were available during my care of the patient were reviewed by me and considered in my medical decision making (see chart for details).   Viral illness.  Rapid strep negative.  Per patient request, COVID and Flu tests pending.  Treating sore throat with viscous lidocaine.  Discussed other symptomatic treatment including ibuprofen, rest, hydration.  Instructed patient to follow up with PCP if symptoms are not improving.  She agrees to plan of care.    Final Clinical Impressions(s) / UC Diagnoses   Final diagnoses:  Viral illness     Discharge Instructions      Your COVID and Flu tests are pending.     Take the ibuprofen as needed for fever or discomfort.  Use the viscous lidocaine as needed for sore throat.  Rest and keep yourself hydrated.    Follow-up with your primary care provider if your symptoms are not improving.         ED Prescriptions     Medication Sig Dispense Auth. Provider   lidocaine (XYLOCAINE) 2 % solution Use as directed 15 mLs in the mouth or throat as needed for mouth pain. 100  mL Sharion Balloon, NP   ibuprofen (ADVIL) 600 MG tablet Take 1 tablet (600 mg total) by mouth every 6 (six) hours as needed. 30 tablet Sharion Balloon, NP      PDMP not reviewed this encounter.   Sharion Balloon, NP 11/08/21 [phone]

## 2021-11-08 NOTE — ED Triage Notes (Signed)
Pt reports nasal congestion, sore throat and generalized body aches x 3 days, fever and headache beginning today.  Denies n/v.  States took Tylenol for fever and headache.

## 2021-11-10 ENCOUNTER — Telehealth: Payer: 59 | Admitting: Family

## 2021-11-10 DIAGNOSIS — H109 Unspecified conjunctivitis: Secondary | ICD-10-CM | POA: Diagnosis not present

## 2021-11-10 LAB — COVID-19, FLU A+B NAA
Influenza A, NAA: NOT DETECTED
Influenza B, NAA: NOT DETECTED
SARS-CoV-2, NAA: NOT DETECTED

## 2021-11-10 MED ORDER — POLYMYXIN B-TRIMETHOPRIM 10000-0.1 UNIT/ML-% OP SOLN: 1.0000 [drp] | Freq: Four times a day (QID) | OPHTHALMIC | 0 refills | Status: AC

## 2021-11-10 NOTE — Progress Notes (Signed)
Virtual Visit Consent   Linda Hurst, you are scheduled for a virtual visit with a [facility] provider today. Just as with appointments in the office, your consent must be obtained to participate. Your consent will be active for this visit and any virtual visit you may have with one of our providers in the next 365 days. If you have a MyChart account, a copy of this consent can be sent to you electronically.  As this is a virtual visit, video technology does not allow for your provider to perform a traditional examination. This may limit your provider's ability to fully assess your condition. If your provider identifies any concerns that need to be evaluated in person or the need to arrange testing (such as labs, EKG, etc.), we will make arrangements to do so. Although advances in technology are sophisticated, we cannot ensure that it will always work on either your end or our end. If the connection with a video visit is poor, the visit may have to be switched to a telephone visit. With either a video or telephone visit, we are not always able to ensure that we have a secure connection.  By engaging in this virtual visit, you consent to the provision of healthcare and authorize for your insurance to be billed (if applicable) for the services provided during this visit. Depending on your insurance coverage, you may receive a charge related to this service.  I need to obtain your verbal consent now. Are you willing to proceed with your visit today? Linda Hurst has provided verbal consent on 11/10/2021 for a virtual visit (video or telephone). Evelina Dun, FNP  Date: 11/10/2021 1:32 PM  Virtual Visit via Video Note   I, Evelina Dun, connected with  Linda Hurst  ([id number], 06-11-1985) on 11/10/21 at  1:30 PM EDT by a video-enabled telemedicine application and verified that I am speaking with the correct person using two identifiers.  Location: Patient: Virtual Visit Location Patient:  Home Provider: Virtual Visit Location Provider: Home Office   I discussed the limitations of evaluation and management by telemedicine and the availability of in person appointments. The patient expressed understanding and agreed to proceed.    History of Present Illness: Linda Hurst is a 36 y.o. who identifies as a female who was assigned female at birth, and is being seen today for right eye discharge.  HPI: Conjunctivitis  The current episode started yesterday. The onset was sudden. The problem occurs continuously. The problem is mild. Associated symptoms include eye itching, rhinorrhea, sore throat, eye discharge and eye pain (improved now). Pertinent negatives include no decreased vision, no double vision, no photophobia, no ear pain and no eye redness. The right eye is affected.    Problems: There are no problems to display for this patient.   Allergies:  Allergies  Allergen Reactions   Tramadol Other (See Comments)   Medications:  Current Outpatient Medications:    trimethoprim-polymyxin b (POLYTRIM) ophthalmic solution, Place 1 drop into the left eye every 6 (six) hours., Disp: 10 mL, Rfl: 0   cyclobenzaprine (FLEXERIL) 10 MG tablet, Take 5-10 mg by mouth at bedtime as needed., Disp: , Rfl:    ibuprofen (ADVIL) 600 MG tablet, Take 1 tablet (600 mg total) by mouth every 6 (six) hours as needed., Disp: 30 tablet, Rfl: 0   lidocaine (XYLOCAINE) 2 % solution, Use as directed 15 mLs in the mouth or throat as needed for mouth pain., Disp: 100 mL, Rfl: 0   meloxicam (MOBIC) 15  MG tablet, Take 15 mg by mouth daily., Disp: , Rfl:   Observations/Objective: Patient is well-developed, well-nourished in no acute distress.  Resting comfortably  at home.  Head is normocephalic, atraumatic.  No labored breathing.  Speech is clear and coherent with logical content.  Patient is alert and oriented at baseline.   Assessment and Plan: 1. Bacterial conjunctivitis of right eye -  trimethoprim-polymyxin b (POLYTRIM) ophthalmic solution; Place 1 drop into the left eye every 6 (six) hours.  Dispense: 10 mL; Refill: 0  Good hand hygiene  Warm compress  Continue OTC medication  Follow up if symptoms worsen or do not improve   Follow Up Instructions: I discussed the assessment and treatment plan with the patient. The patient was provided an opportunity to ask questions and all were answered. The patient agreed with the plan and demonstrated an understanding of the instructions.  A copy of instructions were sent to the patient via MyChart unless otherwise noted below.    The patient was advised to call back or seek an in-person evaluation if the symptoms worsen or if the condition fails to improve as anticipated.  Time:  I spent 5 minutes with the patient via telehealth technology discussing the above problems/concerns.    Evelina Dun, FNP

## 2021-11-12 ENCOUNTER — Telehealth: Payer: 59 | Admitting: Physician Assistant

## 2021-11-12 DIAGNOSIS — J019 Acute sinusitis, unspecified: Secondary | ICD-10-CM | POA: Diagnosis not present

## 2021-11-12 DIAGNOSIS — B9689 Other specified bacterial agents as the cause of diseases classified elsewhere: Secondary | ICD-10-CM

## 2021-11-12 MED ORDER — AMOXICILLIN-POT CLAVULANATE 875-125 MG PO TABS: 1.0000 | ORAL_TABLET | Freq: Two times a day (BID) | ORAL | 0 refills | Status: DC

## 2021-11-12 MED ORDER — FLUTICASONE PROPIONATE 50 MCG/ACT NA SUSP: 2.0000 | Freq: Every day | NASAL | 0 refills | Status: AC

## 2021-11-12 NOTE — Progress Notes (Signed)
Virtual Visit Consent   Linda Hurst, you are scheduled for a virtual visit with a [facility] provider today. Just as with appointments in the office, your consent must be obtained to participate. Your consent will be active for this visit and any virtual visit you may have with one of our providers in the next 365 days. If you have a MyChart account, a copy of this consent can be sent to you electronically.  As this is a virtual visit, video technology does not allow for your provider to perform a traditional examination. This may limit your provider's ability to fully assess your condition. If your provider identifies any concerns that need to be evaluated in person or the need to arrange testing (such as labs, EKG, etc.), we will make arrangements to do so. Although advances in technology are sophisticated, we cannot ensure that it will always work on either your end or our end. If the connection with a video visit is poor, the visit may have to be switched to a telephone visit. With either a video or telephone visit, we are not always able to ensure that we have a secure connection.  By engaging in this virtual visit, you consent to the provision of healthcare and authorize for your insurance to be billed (if applicable) for the services provided during this visit. Depending on your insurance coverage, you may receive a charge related to this service.  I need to obtain your verbal consent now. Are you willing to proceed with your visit today? Linda Hurst has provided verbal consent on 11/12/2021 for a virtual visit (video or telephone). Leeanne Rio, [place]  Date: 11/12/2021 7:04 PM  Virtual Visit via Video Note   I, Leeanne Rio, connected with  Linda Hurst  ([id number], [birth year]) on 11/12/21 at  7:00 PM EDT by a video-enabled telemedicine application and verified that I am speaking with the correct person using two identifiers.  Location: Patient: Virtual Visit  Location Patient: Home Provider: Virtual Visit Location Provider: Home Office   I discussed the limitations of evaluation and management by telemedicine and the availability of in person appointments. The patient expressed understanding and agreed to proceed.    History of Present Illness: Linda Hurst is a 36 y.o. who identifies as a female who was assigned female at birth, and is being seen today for continued head and nasal congestion with increased sinus pressure and discomfort. Now with left ear pressure and popping. Denies fever, chills. Recently diagnosed with viral URI about 4-5 days ago but symptoms continue to progress. Is on Polytrim for a secondary conjunctivitis with noted improvement. Marland Kitchen  HPI: HPI  Problems: There are no problems to display for this patient.   Allergies:  Allergies  Allergen Reactions   Tramadol Other (See Comments)   Medications:  Current Outpatient Medications:    amoxicillin-clavulanate (AUGMENTIN) 875-125 MG tablet, Take 1 tablet by mouth 2 (two) times daily., Disp: 14 tablet, Rfl: 0   fluticasone (FLONASE) 50 MCG/ACT nasal spray, Place 2 sprays into both nostrils daily., Disp: 16 g, Rfl: 0   cyclobenzaprine (FLEXERIL) 10 MG tablet, Take 5-10 mg by mouth at bedtime as needed., Disp: , Rfl:    ibuprofen (ADVIL) 600 MG tablet, Take 1 tablet (600 mg total) by mouth every 6 (six) hours as needed., Disp: 30 tablet, Rfl: 0   lidocaine (XYLOCAINE) 2 % solution, Use as directed 15 mLs in the mouth or throat as needed for mouth pain., Disp: 100 mL, Rfl: 0  meloxicam (MOBIC) 15 MG tablet, Take 15 mg by mouth daily., Disp: , Rfl:    trimethoprim-polymyxin b (POLYTRIM) ophthalmic solution, Place 1 drop into the left eye every 6 (six) hours., Disp: 10 mL, Rfl: 0  Observations/Objective: Patient is well-developed, well-nourished in no acute distress.  Resting comfortably at home.  Head is normocephalic, atraumatic.  No labored breathing. Speech is clear and  coherent with logical content.  Patient is alert and oriented at baseline.   Assessment and Plan: 1. Acute bacterial sinusitis - fluticasone (FLONASE) 50 MCG/ACT nasal spray; Place 2 sprays into both nostrils daily.  Dispense: 16 g; Refill: 0 - amoxicillin-clavulanate (AUGMENTIN) 875-125 MG tablet; Take 1 tablet by mouth 2 (two) times daily.  Dispense: 14 tablet; Refill: 0  Rx Augmentin.  Increase fluids.  Rest.  Saline nasal spray.  Probiotic.  Mucinex as directed.  Humidifier in bedroom. Flonase per orders. Advil Cold and Sinus OTC.  Call or return to clinic if symptoms are not improving.   Follow Up Instructions: I discussed the assessment and treatment plan with the patient. The patient was provided an opportunity to ask questions and all were answered. The patient agreed with the plan and demonstrated an understanding of the instructions.  A copy of instructions were sent to the patient via MyChart unless otherwise noted below.    The patient was advised to call back or seek an in-person evaluation if the symptoms worsen or if the condition fails to improve as anticipated.  Time:  I spent 7 minutes with the patient via telehealth technology discussing the above problems/concerns.    Leeanne Rio, PA-C

## 2021-11-12 NOTE — Patient Instructions (Addendum)
Linda Hurst, thank you for joining Leeanne Rio, PA-C for today's virtual visit.  While this provider is not your primary care provider (PCP), if your PCP is located in our provider database this encounter information will be shared with them immediately following your visit.  Consent: (Patient) Linda Hurst provided verbal consent for this virtual visit at the beginning of the encounter.  Current Medications:  Current Outpatient Medications:    cyclobenzaprine (FLEXERIL) 10 MG tablet, Take 5-10 mg by mouth at bedtime as needed., Disp: , Rfl:    ibuprofen (ADVIL) 600 MG tablet, Take 1 tablet (600 mg total) by mouth every 6 (six) hours as needed., Disp: 30 tablet, Rfl: 0   lidocaine (XYLOCAINE) 2 % solution, Use as directed 15 mLs in the mouth or throat as needed for mouth pain., Disp: 100 mL, Rfl: 0   meloxicam (MOBIC) 15 MG tablet, Take 15 mg by mouth daily., Disp: , Rfl:    trimethoprim-polymyxin b (POLYTRIM) ophthalmic solution, Place 1 drop into the left eye every 6 (six) hours., Disp: 10 mL, Rfl: 0   Medications ordered in this encounter:  No orders of the defined types were placed in this encounter.    *If you need refills on other medications prior to your next appointment, please contact your pharmacy*  Follow-Up: Call back or seek an in-person evaluation if the symptoms worsen or if the condition fails to improve as anticipated.  Other Instructions Please take antibiotic as directed.  Increase fluid intake.  Use Saline nasal spray.  Take a daily multivitamin. Use the Flonase once daily as directed. You can switch your Mucinex to an Advil Cold and Sinus OTC.  Place a humidifier in the bedroom.  Please call or return clinic if symptoms are not improving.  Sinusitis Sinusitis is redness, soreness, and swelling (inflammation) of the paranasal sinuses. Paranasal sinuses are air pockets within the bones of your face (beneath the eyes, the middle of the forehead, or  above the eyes). In healthy paranasal sinuses, mucus is able to drain out, and air is able to circulate through them by way of your nose. However, when your paranasal sinuses are inflamed, mucus and air can become trapped. This can allow bacteria and other germs to grow and cause infection. Sinusitis can develop quickly and last only a short time (acute) or continue over a long period (chronic). Sinusitis that lasts for more than 12 weeks is considered chronic.  CAUSES  Causes of sinusitis include: Allergies. Structural abnormalities, such as displacement of the cartilage that separates your nostrils (deviated septum), which can decrease the air flow through your nose and sinuses and affect sinus drainage. Functional abnormalities, such as when the small hairs (cilia) that line your sinuses and help remove mucus do not work properly or are not present. SYMPTOMS  Symptoms of acute and chronic sinusitis are the same. The primary symptoms are pain and pressure around the affected sinuses. Other symptoms include: Upper toothache. Earache. Headache. Bad breath. Decreased sense of smell and taste. A cough, which worsens when you are lying flat. Fatigue. Fever. Thick drainage from your nose, which often is green and may contain pus (purulent). Swelling and warmth over the affected sinuses. DIAGNOSIS  Your caregiver will perform a physical exam. During the exam, your caregiver may: Look in your nose for signs of abnormal growths in your nostrils (nasal polyps). Tap over the affected sinus to check for signs of infection. View the inside of your sinuses (endoscopy) with a special imaging  device with a light attached (endoscope), which is inserted into your sinuses. If your caregiver suspects that you have chronic sinusitis, one or more of the following tests may be recommended: Allergy tests. Nasal culture A sample of mucus is taken from your nose and sent to a lab and screened for bacteria. Nasal  cytology A sample of mucus is taken from your nose and examined by your caregiver to determine if your sinusitis is related to an allergy. TREATMENT  Most cases of acute sinusitis are related to a viral infection and will resolve on their own within 10 days. Sometimes medicines are prescribed to help relieve symptoms (pain medicine, decongestants, nasal steroid sprays, or saline sprays).  However, for sinusitis related to a bacterial infection, your caregiver will prescribe antibiotic medicines. These are medicines that will help kill the bacteria causing the infection.  Rarely, sinusitis is caused by a fungal infection. In theses cases, your caregiver will prescribe antifungal medicine. For some cases of chronic sinusitis, surgery is needed. Generally, these are cases in which sinusitis recurs more than 3 times per year, despite other treatments. HOME CARE INSTRUCTIONS  Drink plenty of water. Water helps thin the mucus so your sinuses can drain more easily. Use a humidifier. Inhale steam 3 to 4 times a day (for example, sit in the bathroom with the shower running). Apply a warm, moist washcloth to your face 3 to 4 times a day, or as directed by your caregiver. Use saline nasal sprays to help moisten and clean your sinuses. Take over-the-counter or prescription medicines for pain, discomfort, or fever only as directed by your caregiver. SEEK IMMEDIATE MEDICAL CARE IF: You have increasing pain or severe headaches. You have nausea, vomiting, or drowsiness. You have swelling around your face. You have vision problems. You have a stiff neck. You have difficulty breathing. MAKE SURE YOU:  Understand these instructions. Will watch your condition. Will get help right away if you are not doing well or get worse. Document Released: 03/31/2005 Document Revised: 06/23/2011 Document Reviewed: 04/15/2011 [facility] Patient Information 2014 [place], [place].    If you have been instructed to have an  in-person evaluation today at a local Urgent Care facility, please use the link below. It will take you to a list of all of our available [facility], including address, phone number and hours of operation. Please do not delay care.  [facility]  If you or a family member do not have a primary care provider, use the link below to schedule a visit and establish care. When you choose a [facility] primary care physician or advanced practice provider, you gain a long-term partner in health. Find a Primary Care Provider  Learn more about 's in-office and virtual care options: [facility]

## 2021-12-11 ENCOUNTER — Ambulatory Visit: Payer: 59 | Admitting: Cardiovascular Disease

## 2021-12-11 ENCOUNTER — Ambulatory Visit
Admission: EM | Admit: 2021-12-11 | Discharge: 2021-12-11 | Disposition: A | Discharge status: Home / Self Care | Payer: 59 | Attending: Urgent Care | Admitting: Urgent Care

## 2021-12-11 DIAGNOSIS — B353 Tinea pedis: Secondary | ICD-10-CM

## 2021-12-11 DIAGNOSIS — N898 Other specified noninflammatory disorders of vagina: Secondary | ICD-10-CM | POA: Diagnosis present

## 2021-12-11 DIAGNOSIS — N3001 Acute cystitis with hematuria: Secondary | ICD-10-CM | POA: Diagnosis present

## 2021-12-11 DIAGNOSIS — Z20822 Contact with and (suspected) exposure to covid-19: Secondary | ICD-10-CM | POA: Diagnosis not present

## 2021-12-11 DIAGNOSIS — J0110 Acute frontal sinusitis, unspecified: Secondary | ICD-10-CM

## 2021-12-11 DIAGNOSIS — R0981 Nasal congestion: Secondary | ICD-10-CM | POA: Diagnosis present

## 2021-12-11 LAB — POCT URINALYSIS DIP (MANUAL ENTRY)
Bilirubin, UA: NEGATIVE
Glucose, UA: NEGATIVE mg/dL
Ketones, POC UA: NEGATIVE mg/dL
Nitrite, UA: NEGATIVE
Protein Ur, POC: NEGATIVE mg/dL
Spec Grav, UA: 1.01 (ref 1.010–1.025)
Urobilinogen, UA: 0.2 E.U./dL
pH, UA: 6 (ref 5.0–8.0)

## 2021-12-11 LAB — POCT URINE PREGNANCY: Preg Test, Ur: NEGATIVE

## 2021-12-11 MED ORDER — SULFAMETHOXAZOLE-TRIMETHOPRIM 800-160 MG PO TABS: 1.0000 | ORAL_TABLET | Freq: Two times a day (BID) | ORAL | 0 refills | Status: AC

## 2021-12-11 MED ORDER — METHYLPREDNISOLONE 4 MG PO TBPK: ORAL_TABLET | ORAL | 0 refills | Status: AC

## 2021-12-11 NOTE — Discharge Instructions (Addendum)
Athlete's foot: - Purchase athletes foot cream (OTC) from pharmacy. - Purchase athletes foot spray ("goes on dry") from pharmacy. - Wash feet and ensure dry between toes - Apply small amount of cream to problem areas. Ensure Dry. - Spray dry powder.  Sinus infection: - take medrol as directed on package.

## 2021-12-11 NOTE — ED Triage Notes (Addendum)
Patient to Urgent Care with complaints of nasal congestion, headache, flank pain, abscess present to left foot, and abdominal pain.   Nasal congestion: sinus congestion that started yesterday, sore throat for several weeks Headache: left sided, started 3 days ago Flank pain: left sided, sharp and radiates into left lower quadrant x3 days. Denies any dysuria or other urinary symptoms. Also reports some vaginal discharge and reports some itching today. Abscess present to left foot: present to fourth toe, reports that the abscess has been draining but has now stopped. Worried about infection.

## 2021-12-11 NOTE — ED Provider Notes (Signed)
[name]    CSN: [id number] Arrival date & time: 12/11/21  1830      History   Chief Complaint Chief Complaint  Patient presents with   Nasal Congestion   Abscess   Flank Pain   Abdominal Pain   Headache    HPI Linda Hurst is a 36 y.o. female.   Patient presents with multiple complaints:  - "abscess": she has cracked skin, itching and pain between toes 4/5 of L foot.  - flank pain: L sided flank pain along with lower abdominal pain x 1 week. Started with lower abdominal pain (suprapubic) and low L sided flank as well. Vaginal discharge  - abdominal pain:  - headache: "migraine" on left side of her head, above her eye. She endorses some dizziness with movement. This is on/off for about a month, associated with onset of nasal congestion.  - nasal congestion: with Ears popping. Throat sore, mostly at night. Ears were clogged but not now. Was treated about 1 month ago with antibiotic and symptoms mostly resolved. Returned about 2 days ago.  The history is provided by the patient.  Abscess Associated symptoms: headaches   Associated symptoms: no fatigue and no fever   Flank Pain Associated symptoms include abdominal pain and headaches.  Abdominal Pain Associated symptoms: dysuria, sore throat and vaginal discharge   Associated symptoms: no chills, no fatigue and no fever   Headache Associated symptoms: abdominal pain, congestion, drainage, ear pain and sore throat   Associated symptoms: no fatigue and no fever     No past medical history on file.  There are no problems to display for this patient.   No past surgical history on file.  OB History     Gravida  1   Para      Term      Preterm      AB      Living         SAB      IAB      Ectopic      Multiple      Live Births               Home Medications    Prior to Admission medications   Medication Sig Start Date End Date Taking? Authorizing Provider   amoxicillin-clavulanate (AUGMENTIN) 875-125 MG tablet Take 1 tablet by mouth 2 (two) times daily. 11/12/21   Brunetta Jeans, PA-C  cyclobenzaprine (FLEXERIL) 10 MG tablet Take 5-10 mg by mouth at bedtime as needed. 08/16/21   [provider]  fluticasone (FLONASE) 50 MCG/ACT nasal spray Place 2 sprays into both nostrils daily. 11/12/21   Brunetta Jeans, PA-C  ibuprofen (ADVIL) 600 MG tablet Take 1 tablet (600 mg total) by mouth every 6 (six) hours as needed. 11/08/21   Sharion Balloon, NP  lidocaine (XYLOCAINE) 2 % solution Use as directed 15 mLs in the mouth or throat as needed for mouth pain. 11/08/21   Sharion Balloon, NP  meloxicam (MOBIC) 15 MG tablet Take 15 mg by mouth daily. 08/16/21   [provider]  trimethoprim-polymyxin b (POLYTRIM) ophthalmic solution Place 1 drop into the left eye every 6 (six) hours. 11/10/21   Sharion Balloon, FNP    Family History Family History  Problem Relation Age of Onset   Healthy Mother    Healthy Father     Social History Social History   Tobacco Use   Smoking status: Never   Smokeless  tobacco: Never  Substance Use Topics   Alcohol use: Yes     Allergies   Tramadol   Review of Systems Review of Systems  Constitutional:  Negative for chills, fatigue and fever.  HENT:  Positive for congestion, ear pain, postnasal drip, rhinorrhea, sinus pain and sore throat.   Gastrointestinal:  Positive for abdominal pain.  Genitourinary:  Positive for dysuria, flank pain, frequency, pelvic pain, urgency and vaginal discharge.  Neurological:  Positive for headaches.     Physical Exam Triage Vital Signs ED Triage Vitals  Enc Vitals Group     BP 12/11/21 1841 (!) 131/92     Pulse Rate 12/11/21 1841 74     Resp 12/11/21 1841 18     Temp 12/11/21 1841 98.4 F (36.9 C)     Temp src --      SpO2 12/11/21 1841 98 %     Weight 12/11/21 1850 170 lb (77.1 kg)     Height 12/11/21 1850 '5\' 4"'$  (1.626 m)     Head Circumference --      Peak  Flow --      Pain Score 12/11/21 1854 7     Pain Loc --      Pain Edu? --      Excl. in [place]? --    No data found.  Updated Vital Signs BP (!) 131/92   Pulse 74   Temp 98.4 F (36.9 C)   Resp 18   Ht '5\' 4"'$  (1.626 m)   Wt 170 lb (77.1 kg)   SpO2 98%   BMI 29.18 kg/m   Visual Acuity Right Eye Distance:   Left Eye Distance:   Bilateral Distance:    Right Eye Near:   Left Eye Near:    Bilateral Near:     Physical Exam Vitals reviewed.  Constitutional:      General: She is not in acute distress.    Appearance: She is well-developed. She is not ill-appearing.  HENT:     Head: Normocephalic and atraumatic.     Right Ear: Tympanic membrane and ear canal normal.     Left Ear: Tympanic membrane and ear canal normal.     Mouth/Throat:     Pharynx: No oropharyngeal exudate or posterior oropharyngeal erythema.  Abdominal:     Tenderness: There is abdominal tenderness in the suprapubic area. There is left CVA tenderness.  Musculoskeletal:       Feet:  Feet:     Left foot:     Skin integrity: Skin breakdown present.  Skin:    General: Skin is warm and dry.  Neurological:     General: No focal deficit present.     Mental Status: She is alert and oriented to person, place, and time.  Psychiatric:        Mood and Affect: Mood normal.        Behavior: Behavior normal.      UC Treatments / Results  Labs (all labs ordered are listed, but only abnormal results are displayed) Labs Reviewed - No data to display  EKG   Radiology No results found.  Procedures Procedures (including critical care time)  Medications Ordered in UC Medications - No data to display  Initial Impression / Assessment and Plan / UC Course  I have reviewed the triage vital signs and the nursing notes.  Pertinent labs & imaging results that were available during my care of the patient were reviewed by me and considered in my medical decision  making (see chart for details).    Patient  presents with multiple complaints  - athletes foot. Instructed patient to keep feet clean and dry.    - wash feet, drying completely using blow dryer on cool/warm setting if needed.    - apply anti-fungal cream to affected toes, drying well    - apply anti-fungal powder spray  - sinusitis, viral vs allergic. Possible cause of her headache and nasal congestion. Ordered medrol dosepak to relieve sinus inflammation  - abdominal pain, UA positive for leuks, blood, cloudy. Treating for UTI with concern for developing pyelo. Currently no systemic symptoms. Also checking for vaginal infections.  Will contact patient for any change in treatment plan based upon test results. Follow-up with PCP for any unresolved or worsening symptoms.  Final Clinical Impressions(s) / UC Diagnoses   Final diagnoses:  None   Discharge Instructions   None    ED Prescriptions   None    PDMP not reviewed this encounter.   Rose Phi, [place] 12/11/21 1946

## 2021-12-13 ENCOUNTER — Telehealth (HOSPITAL_COMMUNITY): Payer: Self-pay | Admitting: Emergency Medicine

## 2021-12-13 LAB — CERVICOVAGINAL ANCILLARY ONLY
Bacterial Vaginitis (gardnerella): POSITIVE — AB
Candida Glabrata: NEGATIVE
Candida Vaginitis: POSITIVE — AB
Comment: NEGATIVE
Comment: NEGATIVE
Comment: NEGATIVE

## 2021-12-13 LAB — SARS CORONAVIRUS 2 (TAT 6-24 HRS): SARS Coronavirus 2: NEGATIVE

## 2021-12-13 MED ORDER — FLUCONAZOLE 150 MG PO TABS: 150.0000 mg | ORAL_TABLET | Freq: Once | ORAL | 0 refills | Status: AC

## 2021-12-13 MED ORDER — METRONIDAZOLE 500 MG PO TABS: 500.0000 mg | ORAL_TABLET | Freq: Two times a day (BID) | ORAL | 0 refills | Status: AC

## 2022-01-06 DIAGNOSIS — N760 Acute vaginitis: Secondary | ICD-10-CM | POA: Diagnosis not present

## 2022-01-06 DIAGNOSIS — R0789 Other chest pain: Secondary | ICD-10-CM | POA: Diagnosis not present

## 2022-01-06 DIAGNOSIS — B3731 Acute candidiasis of vulva and vagina: Secondary | ICD-10-CM | POA: Diagnosis not present

## 2022-01-06 DIAGNOSIS — N898 Other specified noninflammatory disorders of vagina: Secondary | ICD-10-CM | POA: Diagnosis not present

## 2022-01-06 DIAGNOSIS — B9689 Other specified bacterial agents as the cause of diseases classified elsewhere: Secondary | ICD-10-CM | POA: Diagnosis not present

## 2022-02-03 ENCOUNTER — Ambulatory Visit: Payer: Medicaid Other | Admitting: Cardiology

## 2022-02-17 ENCOUNTER — Ambulatory Visit
Admission: EM | Admit: 2022-02-17 | Discharge: 2022-02-17 | Disposition: A | Discharge status: Home / Self Care | Payer: 59

## 2022-02-17 ENCOUNTER — Ambulatory Visit (INDEPENDENT_AMBULATORY_CARE_PROVIDER_SITE_OTHER): Payer: 59

## 2022-02-17 DIAGNOSIS — S6990XA Unspecified injury of unspecified wrist, hand and finger(s), initial encounter: Secondary | ICD-10-CM | POA: Diagnosis not present

## 2022-02-17 DIAGNOSIS — M79644 Pain in right finger(s): Secondary | ICD-10-CM | POA: Diagnosis not present

## 2022-02-17 NOTE — ED Provider Notes (Signed)
[name]    CSN: [id number] Arrival date & time: 02/17/22  1742      History   Chief Complaint Chief Complaint  Patient presents with   Finger Injury    HPI Arlys Scatena is a 36 y.o. female.   HPI  Presents to UC with complaint of pain in her right middle finger following an injury while playing basketball with her son.  She states she jammed her finger against the ball.  Endorses limited range of motion and pain when bending and extending the finger.  History reviewed. No pertinent past medical history.  Patient Active Problem List   Diagnosis Date Noted   Nasal congestion 12/11/2021   Request for sterilization 03/04/2021   Uterine fibroids affecting pregnancy, antepartum 03/01/2021   History of abnormal cervical Pap smear 11/26/2020   History of herpes genitalis 11/26/2020   Iron deficiency anemia 11/26/2020   Previous cesarean delivery affecting pregnancy, antepartum 11/26/2020   History of preterm delivery, currently pregnant in second trimester 11/26/2020   Supervision of high risk pregnancy in second trimester 11/26/2020    History reviewed. No pertinent surgical history.  OB History     Gravida  1   Para      Term      Preterm      AB      Living         SAB      IAB      Ectopic      Multiple      Live Births               Home Medications    Prior to Admission medications   Medication Sig Start Date End Date Taking? Authorizing Provider  fluconazole (DIFLUCAN) 150 MG tablet Take 150 mg by mouth once. 01/06/22  Yes [provider]  HYDROXYprogesterone caproate [facility]) autoinjector Inject into the skin. 01/25/21  Yes [provider]  naproxen (NAPROSYN) 500 MG tablet Take 500 mg by mouth 2 (two) times daily. 01/06/22  Yes [provider]  triamcinolone ointment (KENALOG) 0.1 % SMARTSIG:sparingly Topical Twice Daily 11/15/21  Yes [provider]  amoxicillin-clavulanate (AUGMENTIN)  875-125 MG tablet Take 1 tablet by mouth 2 (two) times daily. 11/12/21   Brunetta Jeans, PA-C  cyclobenzaprine (FLEXERIL) 10 MG tablet Take 5-10 mg by mouth at bedtime as needed. 08/16/21   [provider]  fluticasone (FLONASE) 50 MCG/ACT nasal spray Place 2 sprays into both nostrils daily. 11/12/21   Brunetta Jeans, PA-C  ibuprofen (ADVIL) 600 MG tablet Take 1 tablet (600 mg total) by mouth every 6 (six) hours as needed. 11/08/21   Sharion Balloon, NP  lidocaine (XYLOCAINE) 2 % solution Use as directed 15 mLs in the mouth or throat as needed for mouth pain. 11/08/21   Sharion Balloon, NP  meloxicam (MOBIC) 15 MG tablet Take 15 mg by mouth daily. 08/16/21   [provider]  methylPREDNISolone (MEDROL DOSEPAK) 4 MG TBPK tablet Take as directed on package 12/11/21   Dwan Hemmelgarn, Annie Main, FNP  metroNIDAZOLE (FLAGYL) 500 MG tablet Take 1 tablet (500 mg total) by mouth 2 (two) times daily. 12/13/21   Lamptey, Myrene Galas, MD  trimethoprim-polymyxin b (POLYTRIM) ophthalmic solution Place 1 drop into the left eye every 6 (six) hours. 11/10/21   Sharion Balloon, FNP  valACYclovir (VALTREX) 500 MG tablet Take 500 mg by mouth 2 (two) times daily as needed.    [provider]  Family History Family History  Problem Relation Age of Onset   Healthy Mother    Healthy Father     Social History Social History   Tobacco Use   Smoking status: Never   Smokeless tobacco: Never  Substance Use Topics   Alcohol use: Yes     Allergies   Tramadol   Review of Systems Review of Systems   Physical Exam Triage Vital Signs ED Triage Vitals [02/17/22 1830]  Enc Vitals Group     BP      Pulse      Resp      Temp      Temp src      SpO2      Weight      Height      Head Circumference      Peak Flow      Pain Score 6     Pain Loc      Pain Edu?      Excl. in [place]?    No data found.  Updated Vital Signs LMP 01/20/2022 (Exact Date)   Breastfeeding No   Visual Acuity Right Eye  Distance:   Left Eye Distance:   Bilateral Distance:    Right Eye Near:   Left Eye Near:    Bilateral Near:     Physical Exam Vitals reviewed.  Constitutional:      Appearance: Normal appearance.  Musculoskeletal:       Hands:  Skin:    General: Skin is warm and dry.  Neurological:     General: No focal deficit present.     Mental Status: She is alert and oriented to person, place, and time.  Psychiatric:        Mood and Affect: Mood normal.        Behavior: Behavior normal.      UC Treatments / Results  Labs (all labs ordered are listed, but only abnormal results are displayed) Labs Reviewed - No data to display  EKG   Radiology No results found.  Procedures Procedures (including critical care time)  Medications Ordered in UC Medications - No data to display  Initial Impression / Assessment and Plan / UC Course  I have reviewed the triage vital signs and the nursing notes.  Pertinent labs & imaging results that were available during my care of the patient were reviewed by me and considered in my medical decision making (see chart for details).   Suspect soft tissue bruising at the proximal interphalangeal joint of the right middle finger.  Patient requests imaging to rule out fracture.  X-ray shows no fracture. Will buddy tape finger and recommend gentle extension/flexion exercise.   Final Clinical Impressions(s) / UC Diagnoses   Final diagnoses:  None   Discharge Instructions   None    ED Prescriptions   None    PDMP not reviewed this encounter.   Rose Phi, [place] 02/17/22 1934

## 2022-02-17 NOTE — Discharge Instructions (Signed)
Follow up here or with your primary care provider if your symptoms are worsening or not improving.     

## 2022-02-17 NOTE — ED Triage Notes (Signed)
Pt. Presents to UC w/ c/o injury to the right middle finger. Pt. States she jammed it a week ago and has been experiencing swelling and limited ROM.

## 2022-03-02 IMAGING — US US OB COMP LESS 14 WK
1 series · 14 of 28 positions shown · non-contrast
Comparison: None.

CLINICAL DATA: Vaginal bleeding for 1 day, quantitative beta HCG
97,347

EXAM:
OBSTETRIC <14 WK ULTRASOUND
TECHNIQUE: Transabdominal ultrasound was performed for evaluation of the
gestation as well as the maternal uterus and adnexal regions.

[Series 1: us ob less than 14 weeks with ob transvaginal · 14 of 68 slices shown]
[im 3/68]
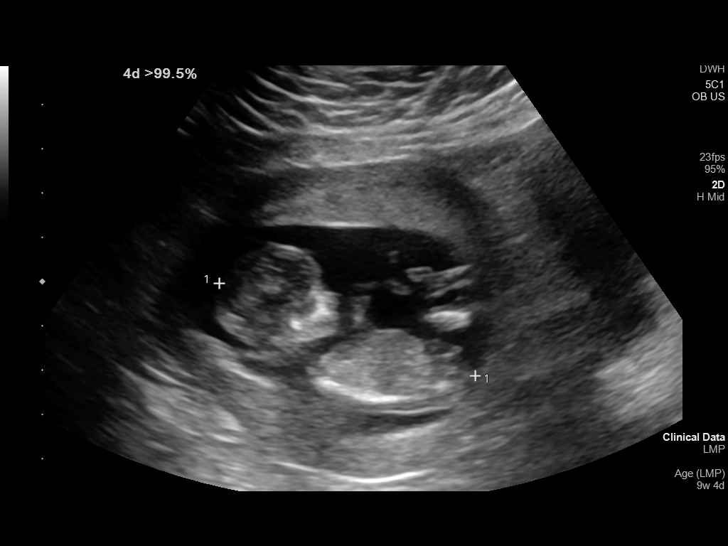
[im 8/68]
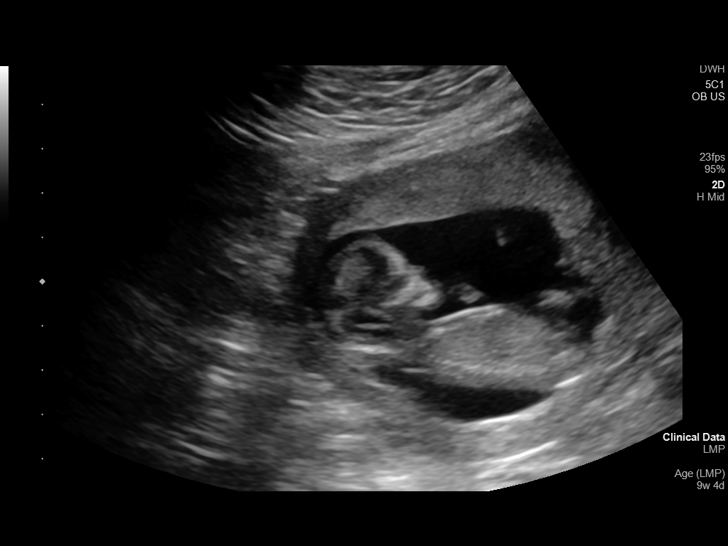
[im 13/68]
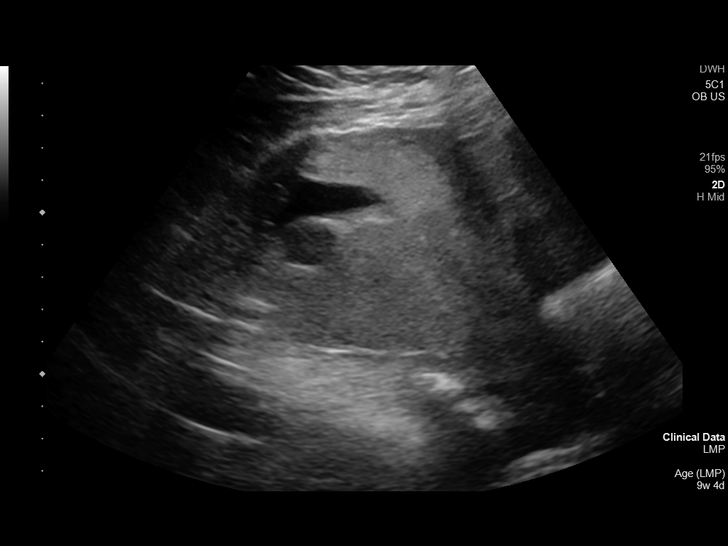
[im 18/68]
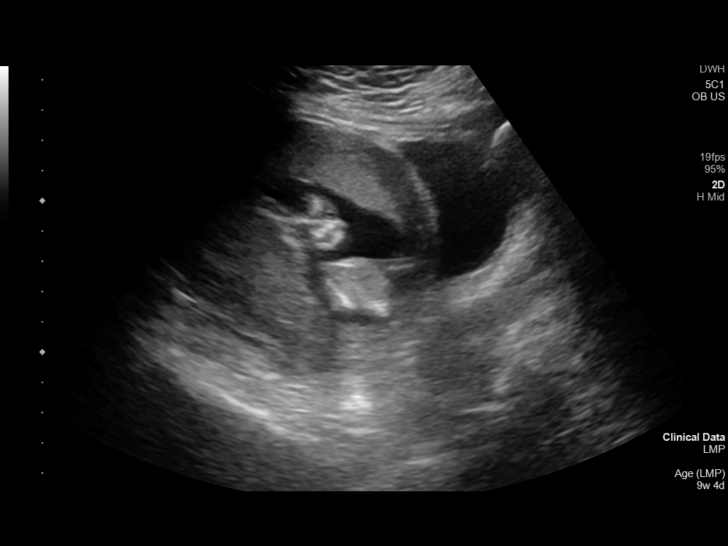
[im 23/68]
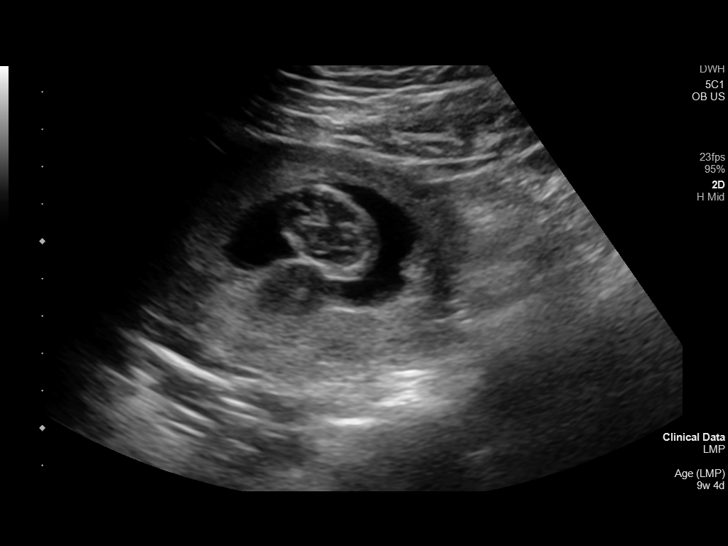
[im 28/68]
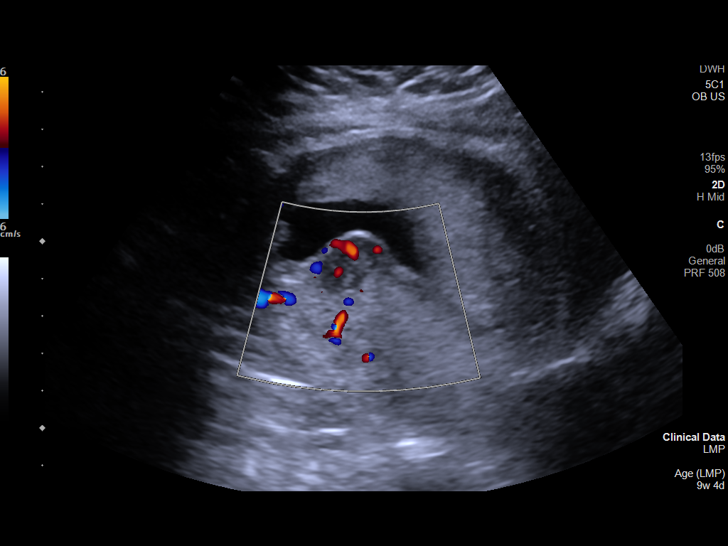
[im 33/68]
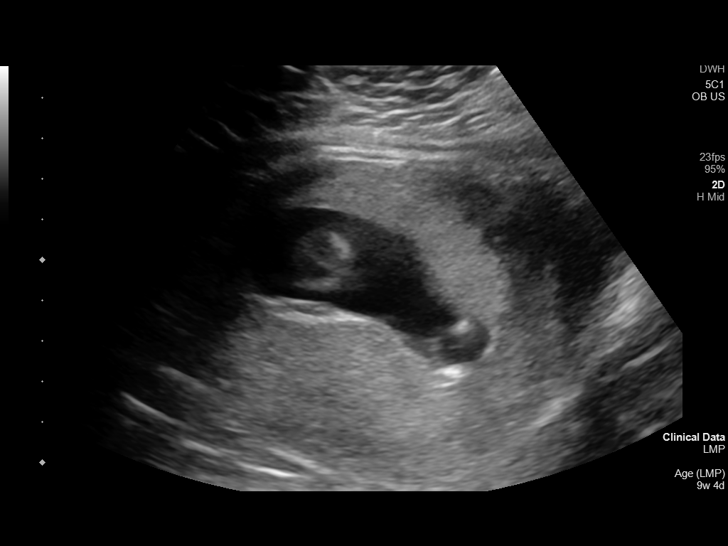
[im 38/68]
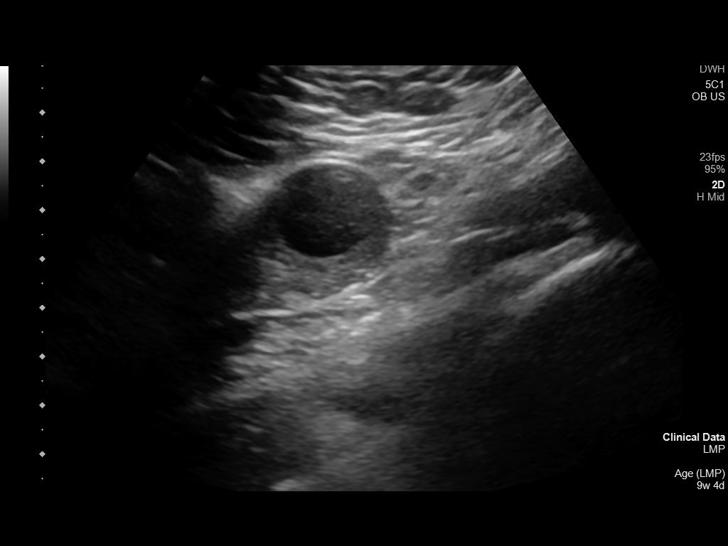
[im 43/68]
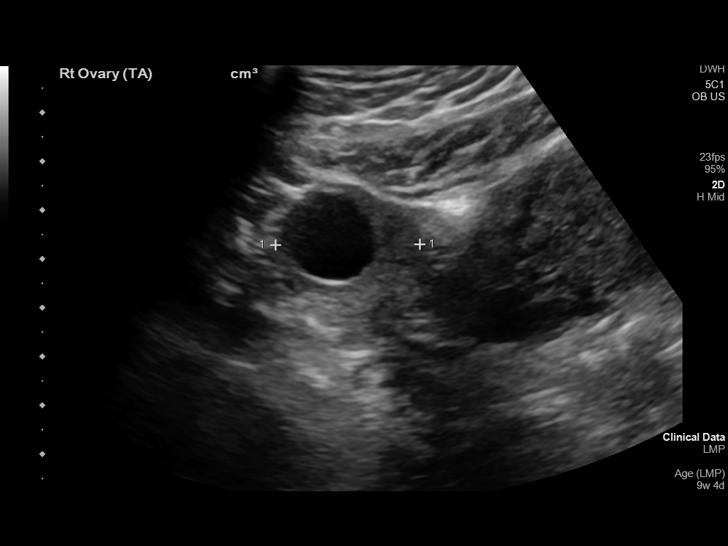
[im 48/68]
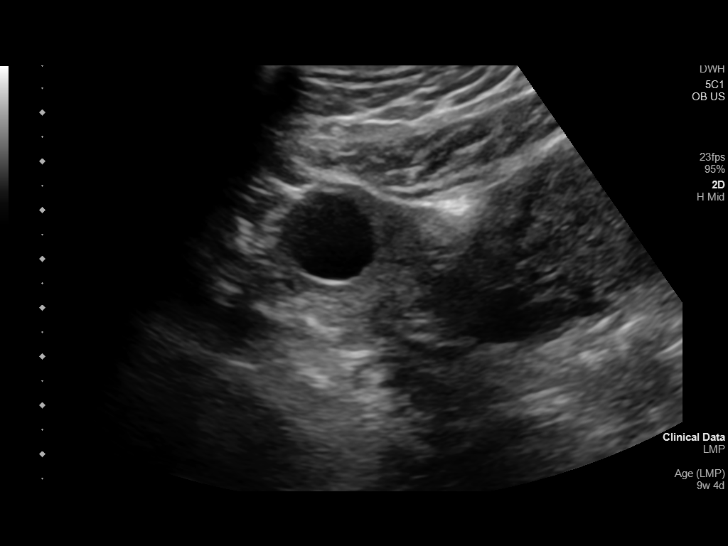
[im 53/68]
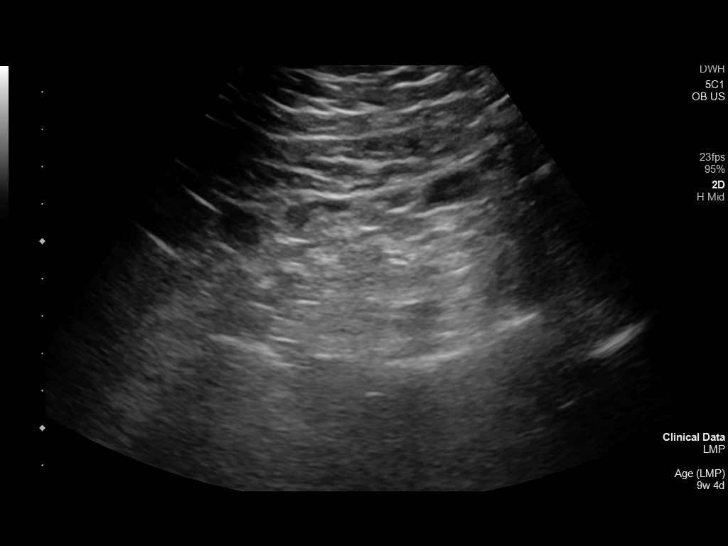
[im 58/68]
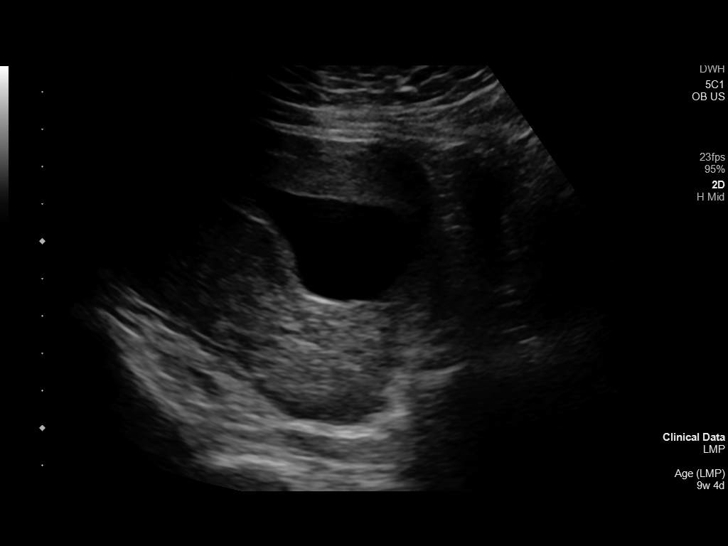
[im 63/68]
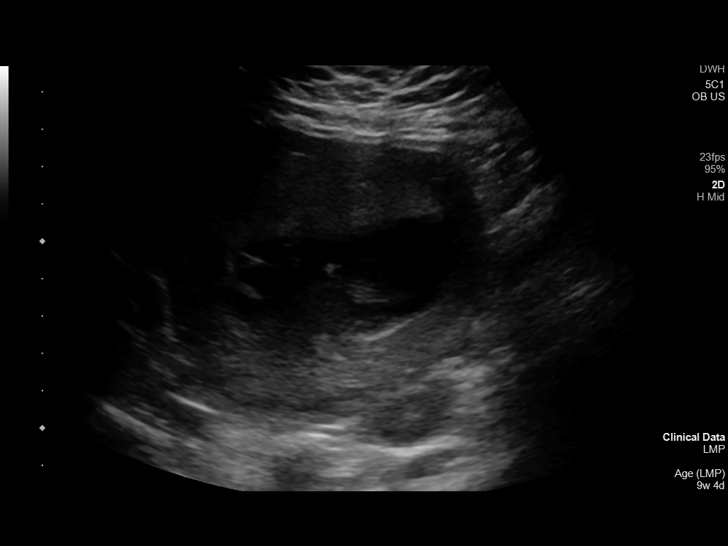
[im 68/68]
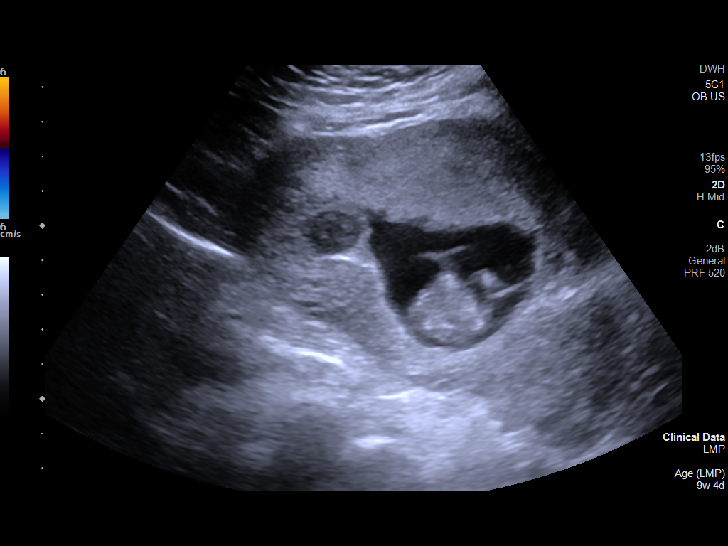

[14 of 28 positions shown; findings below may reference images not displayed]

FINDINGS: Intrauterine gestational sac: Single

Yolk sac:  Not Visualized.

Embryo:  Visualized.

Cardiac Activity: Visualized.

Heart Rate: 160 bpm

CRL:   60 mm   12 w 3 d                  US EDC: 05/18/2021

Subchorionic hemorrhage:  None.

Maternal uterus/adnexae: Anteverted maternal uterus. Hypoechoic
structure along the posterior uterine body measuring 1.8 x 1.4 x
cm, possibly a uterine fibroid. Probable corpus luteum in the right
ovary measuring 2.4 x 2.0 x 2.1 cm. No concerning adnexal masses.
IMPRESSION: 1. Single intrauterine gestation at 12 weeks, 3 days by crown-rump
length sonographic estimation.
2. Nonvisualization of the yolk sac is a typical physiologic finding
after 10 weeks of gestation.
3. Suspect a small uterine fibroid measuring 1.8 cm along the
posterior uterine body.
4. No other acute or significant sonographic abnormality.

## 2022-03-20 ENCOUNTER — Ambulatory Visit: Payer: Medicaid Other | Admitting: Cardiology

## 2022-07-16 ENCOUNTER — Other Ambulatory Visit: Payer: Self-pay

## 2022-07-16 ENCOUNTER — Encounter: Payer: Self-pay | Admitting: *Deleted

## 2022-07-16 DIAGNOSIS — Z5321 Procedure and treatment not carried out due to patient leaving prior to being seen by health care provider: Secondary | ICD-10-CM | POA: Diagnosis not present

## 2022-07-16 DIAGNOSIS — R519 Headache, unspecified: Secondary | ICD-10-CM | POA: Insufficient documentation

## 2022-07-16 NOTE — ED Triage Notes (Signed)
Pt has a headache since this am.  No n/v/  pt reports she also has joint pain and is taking meloxcam for 3 days without relief.  Pt alert  speech clear.  Pt ambulates without diff.

## 2022-07-17 ENCOUNTER — Emergency Department
Admission: EM | Admit: 2022-07-17 | Discharge: 2022-07-17 | Discharge status: Against Medical Advice | Payer: BC Managed Care – PPO | Attending: Emergency Medicine | Admitting: Emergency Medicine

## 2022-10-01 ENCOUNTER — Institutional Professional Consult (permissible substitution): Payer: BC Managed Care – PPO | Admitting: Plastic Surgery

## 2022-10-05 ENCOUNTER — Emergency Department: Payer: BC Managed Care – PPO

## 2022-10-05 ENCOUNTER — Other Ambulatory Visit: Payer: Self-pay

## 2022-10-05 ENCOUNTER — Emergency Department
Admission: EM | Admit: 2022-10-05 | Discharge: 2022-10-05 | Disposition: A | Discharge status: Home / Self Care | Payer: BC Managed Care – PPO | Attending: Emergency Medicine | Admitting: Emergency Medicine

## 2022-10-05 DIAGNOSIS — L03114 Cellulitis of left upper limb: Secondary | ICD-10-CM | POA: Diagnosis not present

## 2022-10-05 DIAGNOSIS — M25522 Pain in left elbow: Secondary | ICD-10-CM | POA: Diagnosis present

## 2022-10-05 LAB — BASIC METABOLIC PANEL
Anion gap: 8 (ref 5–15)
BUN: 13 mg/dL (ref 6–20)
CO2: 24 mmol/L (ref 22–32)
Calcium: 9.1 mg/dL (ref 8.9–10.3)
Chloride: 107 mmol/L (ref 98–111)
Creatinine, Ser: 0.82 mg/dL (ref 0.44–1.00)
GFR, Estimated: >60 mL/min (ref >60)
Glucose, Bld: 101 mg/dL — ABNORMAL HIGH (ref 70–99)
Potassium: 3.6 mmol/L (ref 3.5–5.1)
Sodium: 139 mmol/L (ref 135–145)

## 2022-10-05 LAB — CBC
HCT: 36.9 % (ref 36.0–46.0)
Hemoglobin: 11.7 g/dL — ABNORMAL LOW (ref 12.0–15.0)
MCH: 27.3 pg (ref 26.0–34.0)
MCHC: 31.7 g/dL (ref 30.0–36.0)
MCV: 86 fL (ref 80.0–100.0)
Platelets: 292 10*3/uL (ref 150–400)
RBC: 4.29 MIL/uL (ref 3.87–5.11)
RDW: 14 % (ref 11.5–15.5)
WBC: 6.5 10*3/uL (ref 4.0–10.5)
nRBC: 0 % (ref 0.0–0.2)

## 2022-10-05 MED ORDER — OXYCODONE-ACETAMINOPHEN 5-325 MG PO TABS: 1.0000 | ORAL_TABLET | Freq: Four times a day (QID) | ORAL | 0 refills | Status: AC | PRN

## 2022-10-05 MED ORDER — OXYCODONE-ACETAMINOPHEN 5-325 MG PO TABS
1.0000 | ORAL_TABLET | Freq: Once | ORAL | Status: AC
  Administered 2022-10-05: 1 via ORAL
  Filled 2022-10-05: qty 1

## 2022-10-05 MED ORDER — CLINDAMYCIN HCL 300 MG PO CAPS: 300.0000 mg | ORAL_CAPSULE | Freq: Four times a day (QID) | ORAL | 0 refills | Status: AC

## 2022-10-05 MED ORDER — CLINDAMYCIN HCL 150 MG PO CAPS
600.0000 mg | ORAL_CAPSULE | Freq: Once | ORAL | Status: AC
  Administered 2022-10-05: 600 mg via ORAL
  Filled 2022-10-05: qty 4

## 2022-10-05 NOTE — ED Provider Notes (Signed)
[facility] Provider Note  Patient Contact: 10:53 PM (approximate)   History   Abscess   HPI  Linda Hurst is a 37 y.o. female who presents emergency department complaining of elbow infection.  Patient states that she has had some increasing pain, erythema and some clear/yellowish drainage out of the elbow.  Still able to move the elbow joint, no pain to the anterior elbow.  Symptoms seem localized to the skin of the posterior elbow.  No history of recurrent skin infections.  No injuries to the area.     Physical Exam   Triage Vital Signs: ED Triage Vitals  Enc Vitals Group     BP 10/05/22 2137 (!) 146/94     Pulse Rate 10/05/22 2137 87     Resp 10/05/22 2137 17     Temp 10/05/22 2137 98.9 F (37.2 C)     Temp Source 10/05/22 2137 Oral     SpO2 10/05/22 2137 100 %     Weight 10/05/22 2137 162 lb (73.5 kg)     Height 10/05/22 2137 5\' 4"  (1.626 m)     Head Circumference --      Peak Flow --      Pain Score 10/05/22 2137 10     Pain Loc --      Pain Edu? --      Excl. in [place]? --     Most recent vital signs: Vitals:   10/05/22 2137  BP: (!) 146/94  Pulse: 87  Resp: 17  Temp: 98.9 F (37.2 C)  SpO2: 100%     General: Alert and in no acute distress.  Cardiovascular:  Good peripheral perfusion Respiratory: Normal respiratory effort without tachypnea or retractions. Lungs CTAB.  Musculoskeletal: Full range of motion to all extremities.  Visualization of the left elbow reveals edema along the olecranon region.  No evidence of bursitis.  There is findings consistent with cellulitis.  No tenderness over the joint itself.  Bedside ultrasound performed with no findings consistent with abscess. Neurologic:  No gross focal neurologic deficits are appreciated.  Skin:   No rash noted Other:   ED Results / Procedures / Treatments   Labs (all labs ordered are listed, but only abnormal results are displayed) Labs Reviewed  CBC - Abnormal;  Notable for the following components:      Result Value   Hemoglobin 11.7 (*)    All other components within normal limits  BASIC METABOLIC PANEL - Abnormal; Notable for the following components:   Glucose, Bld 101 (*)    All other components within normal limits     EKG     RADIOLOGY  I personally viewed, evaluated, and interpreted these images as part of my medical decision making, as well as reviewing the written report by the radiologist.  ED Provider Interpretation: Soft tissue edema but no evidence of subcu gas or osteomyelitis.  DG Elbow 2 Views Left  Result Date: 10/05/2022 CLINICAL DATA:  Swelling, abscess, drainage EXAM: LEFT ELBOW - 2 VIEW COMPARISON:  None Available. FINDINGS: Frontal and lateral views of the left elbow are obtained. No acute or destructive bony abnormalities. No periosteal reaction. Joint spaces are well preserved. No joint effusion. Marked dorsal soft tissue swelling and subcutaneous fat stranding consistent with given history of infection. No subcutaneous gas. IMPRESSION: 1. Dorsal soft tissue swelling compatible with given history of infection. No evidence of subcutaneous gas. 2. No acute bony abnormality.  No evidence of osteomyelitis. Electronically Signed  By: Sharlet Salina M.D.   On: 10/05/2022 21:57    PROCEDURES:  Critical Care performed: No  Ultrasound ED Soft Tissue  Date/Time: 10/05/2022 10:55 PM  Performed by: Racheal Patches, PA-C Authorized by: Racheal Patches, PA-C   Procedure details:    Indications: localization of abscess and evaluate for cellulitis     Transverse view:  Visualized   Longitudinal view:  Visualized   Images: not archived   Location:    Location: upper extremity     Side:  Left Findings:     no abscess present    cellulitis present Comments:     Bedside ultrasound performed to the left elbow.  Mild cellulitic changes with stranding but no discrete fluid collection concerning for  abscess.    MEDICATIONS ORDERED IN ED: Medications  clindamycin (CLEOCIN) capsule 600 mg (has no administration in time range)  oxyCODONE-acetaminophen (PERCOCET/ROXICET) 5-325 MG per tablet 1 tablet (has no administration in time range)     IMPRESSION / MDM / ASSESSMENT AND PLAN / ED COURSE  I reviewed the triage vital signs and the nursing notes.                                 Differential diagnosis includes, but is not limited to, cellulitis of elbow, abscess of the elbow, bursitis, septic arthritis   Patient's presentation is most consistent with acute presentation with potential threat to life or bodily function.   Patient's diagnosis is consistent with cellulitis of the elbow.  Patient presents emergency department complaining with pain and swelling of the skin of the elbow.  Patient has no joint pain, no pain with movement.  She was nontender to palpation of the joint itself.  She did have cellulitic changes to the posterior elbow.  There was a small what appeared to be draining vesicle.  There was no purulent drainage though there was a serosanguineous drainage from this area.  Bedside ultrasound was performed with no evidence of abscess.  Findings consistent with cellulitis.  No osteomyelitis on x-ray.  Labs are reassuring at this time.  No indication for aspiration of the joint.  I will start antibiotics, limited pain medication for the patient.  Follow-up with primary care or orthopedics as needed..  Patient is given ED precautions to return to the ED for any worsening or new symptoms.     FINAL CLINICAL IMPRESSION(S) / ED DIAGNOSES   Final diagnoses:  Cellulitis of left elbow     Rx / DC Orders   ED Discharge Orders     None        Note:  This document was prepared using Dragon voice recognition software and may include unintentional dictation errors.   Lanette Hampshire 10/05/22 2318    Georga Hacking, MD 10/07/22 [phone]

## 2022-10-05 NOTE — ED Triage Notes (Signed)
Pt sts that she has a abscess on her left elbow. Pt sts that there was drainage the other day but now it is getting bigger and is painful.

## 2022-12-17 ENCOUNTER — Institutional Professional Consult (permissible substitution): Payer: Medicaid Other | Admitting: Plastic Surgery

## 2023-01-08 IMAGING — DX DG ABDOMEN 1V
2 series · 2 of 2 positions shown · non-contrast
Comparison: None Available.

CLINICAL DATA: abd pain, constipation

EXAM:
ABDOMEN - 1 VIEW

[abdomen supine (1 of 2)]
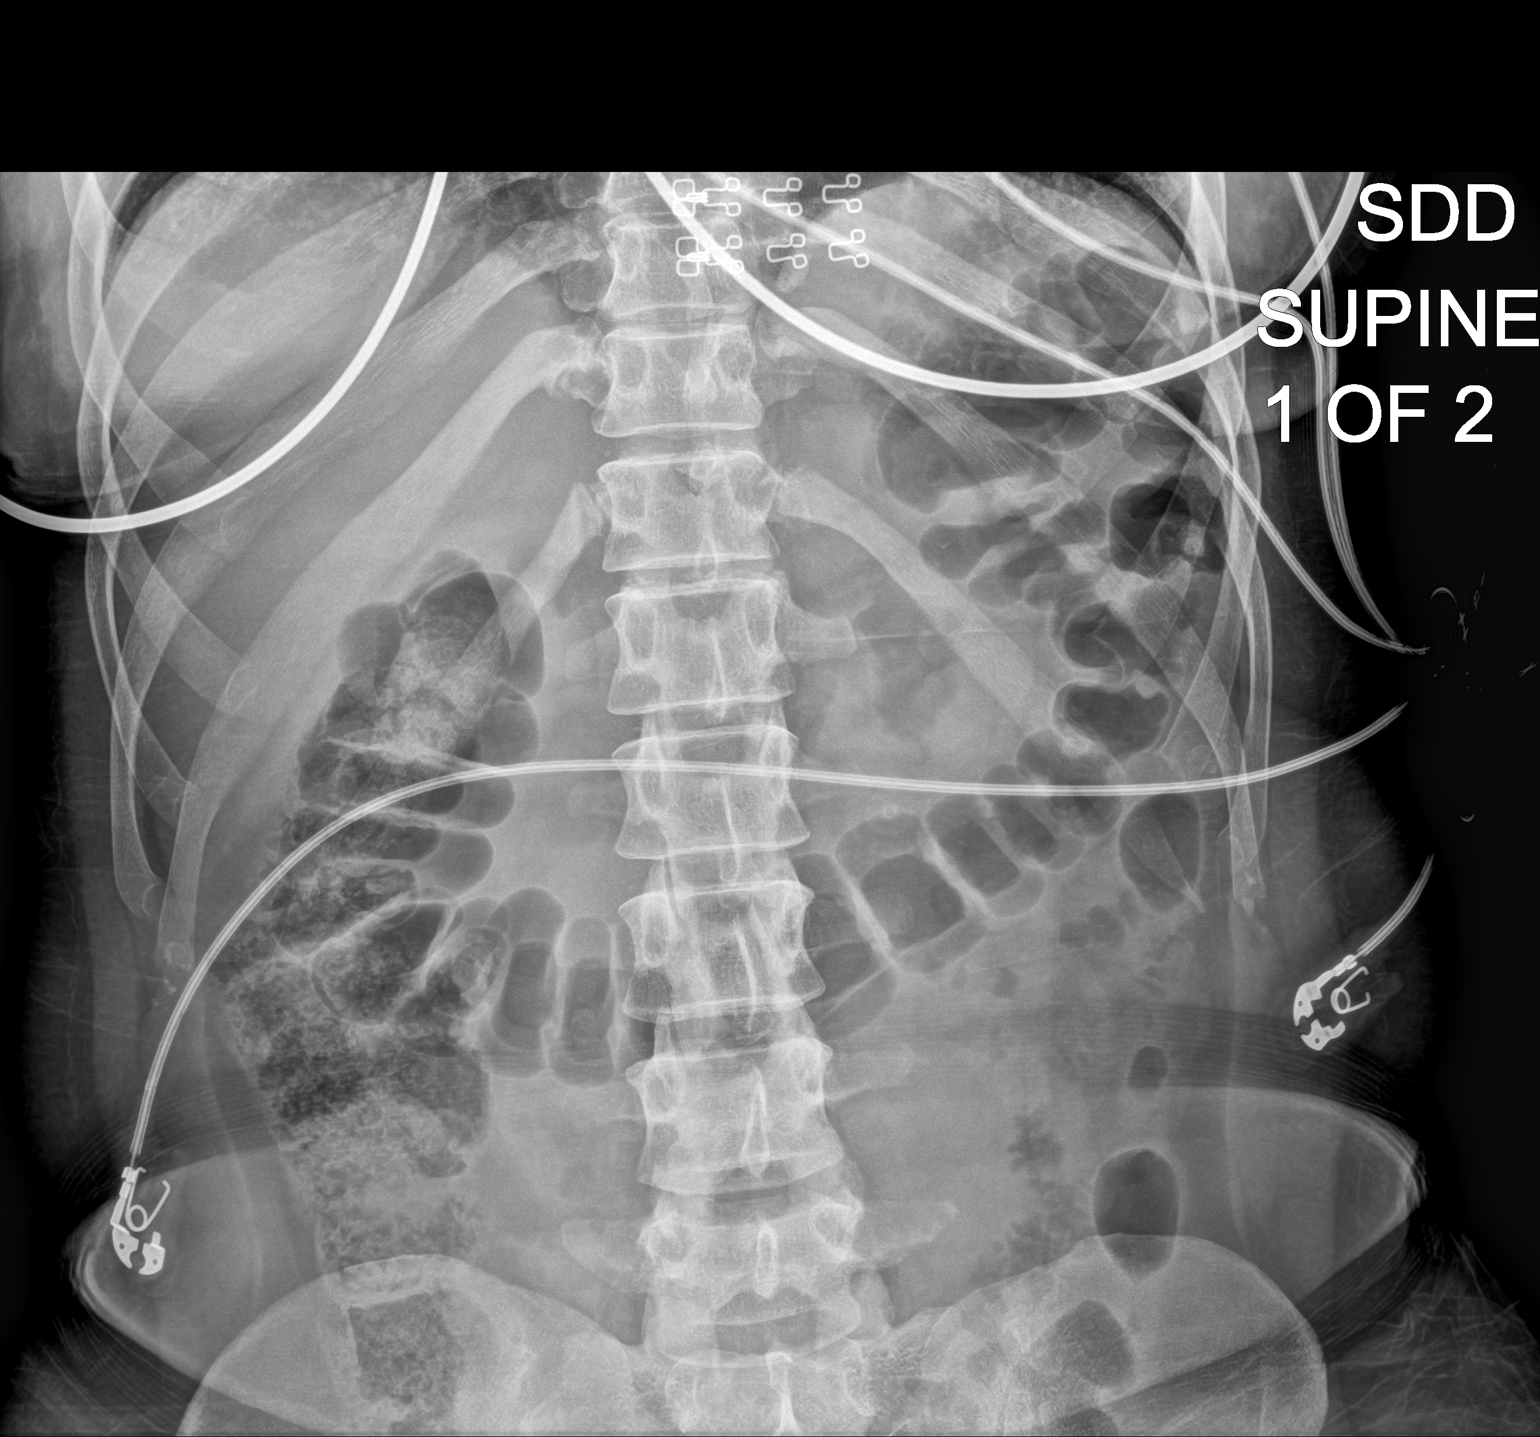

[abdomen supine (2 of 2)]
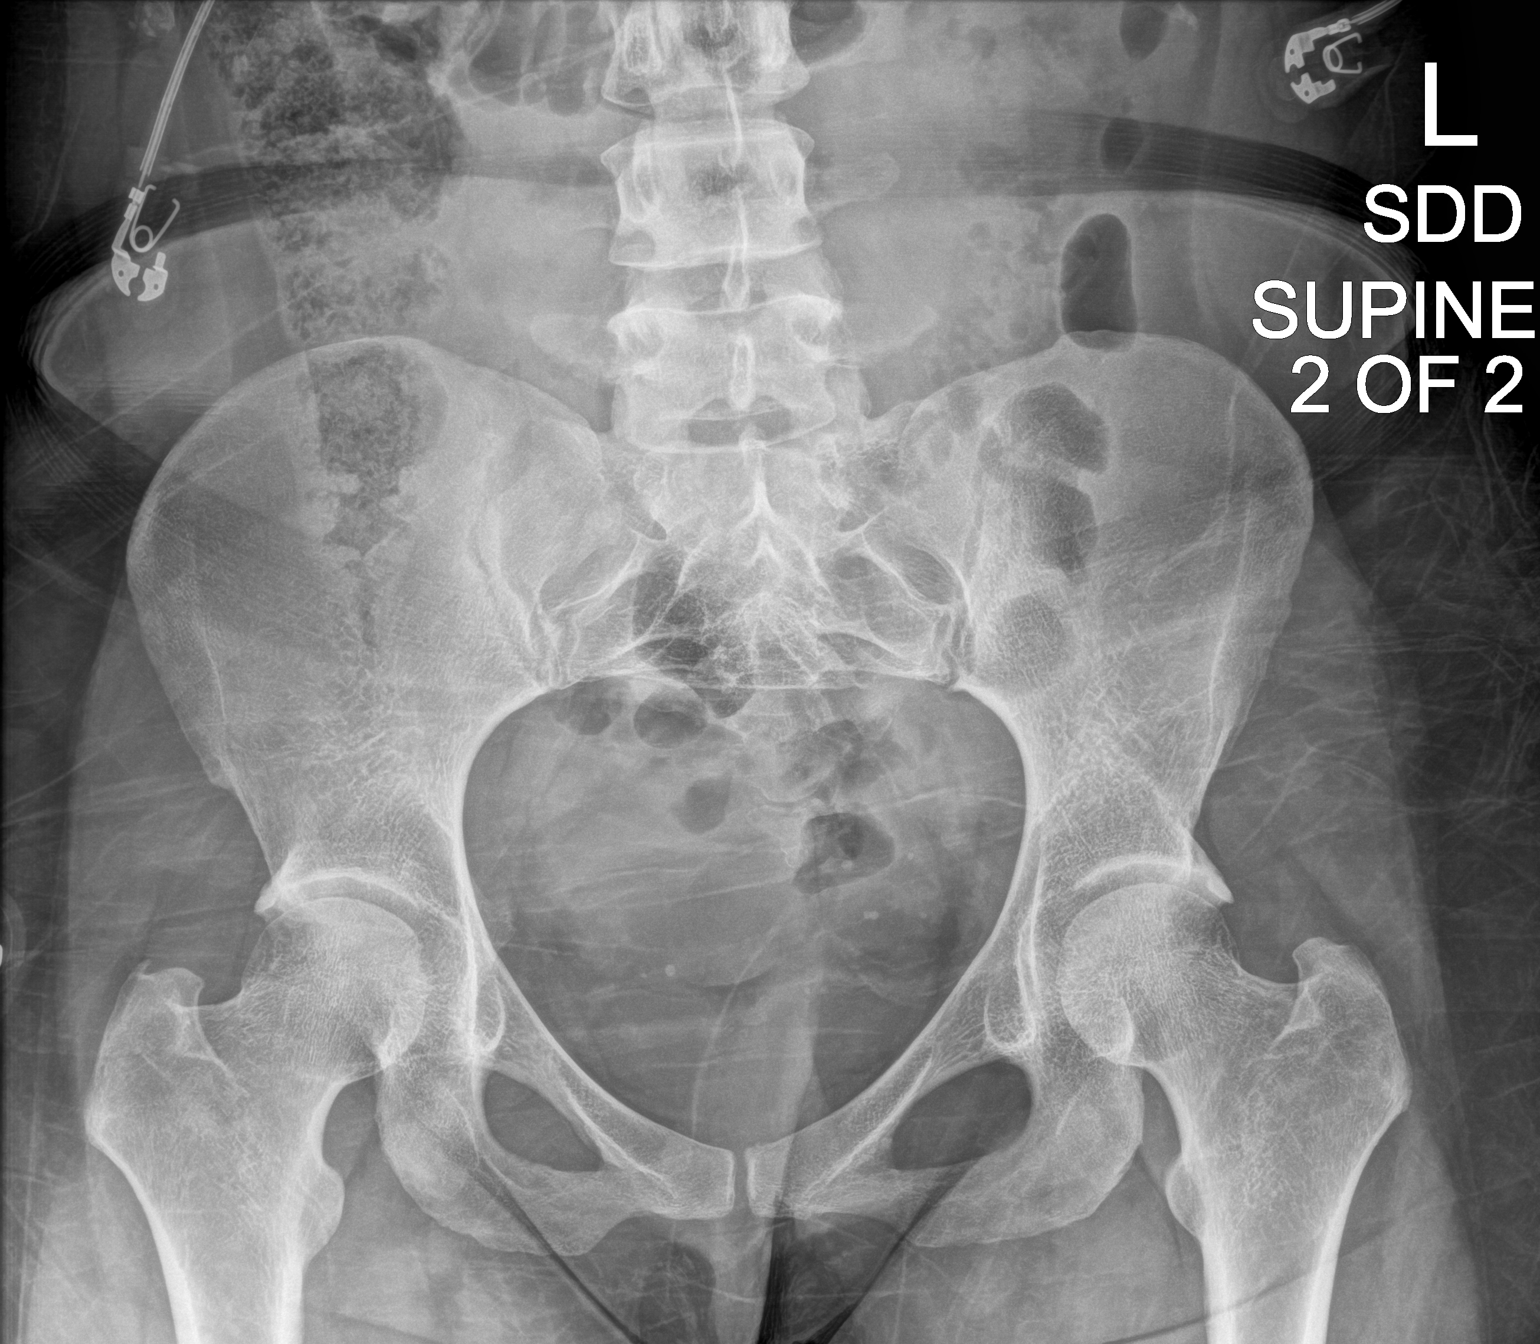

[2 of 2 positions shown; findings below may reference images not displayed]

FINDINGS: The bowel gas pattern is normal. No radio-opaque calculi or other
significant radiographic abnormality are seen.
IMPRESSION: Negative.

## 2023-04-14 ENCOUNTER — Ambulatory Visit
Admission: EM | Admit: 2023-04-14 | Discharge: 2023-04-14 | Disposition: A | Discharge status: Home / Self Care | Payer: 59 | Attending: Emergency Medicine | Admitting: Emergency Medicine

## 2023-04-14 DIAGNOSIS — H6502 Acute serous otitis media, left ear: Secondary | ICD-10-CM

## 2023-04-14 MED ORDER — AMOXICILLIN-POT CLAVULANATE 875-125 MG PO TABS: 1.0000 | ORAL_TABLET | Freq: Two times a day (BID) | ORAL | 0 refills | Status: AC

## 2023-04-14 NOTE — Discharge Instructions (Signed)
 Today you are being treated for an infection of the eardrum  Take augmentin  twice daily for 7 days, you should begin to see improvement after 48 hours of medication use and then it should progressively get better  You may use Tylenol or ibuprofen for management of discomfort  May hold warm compresses to the ear for additional comfort  Please not attempted any ear cleaning or object or fluid placement into the ear canal to prevent further irritation

## 2023-04-14 NOTE — ED Provider Notes (Signed)
 Linda Hurst    CSN: [id number] Arrival date & time: 04/14/23  1622      History   Chief Complaint Chief Complaint  Patient presents with   Ear Fullness    HPI Linda Hurst is a 37 y.o. female.   Presents for evaluation of left-sided ear fullness and pain present for 7 days.  Recent viral illness with lingering mild nasal congestion.  Has not attempted treatment.  Denies new fever.  History reviewed. No pertinent past medical history.  Patient Active Problem List   Diagnosis Date Noted   Nasal congestion 12/11/2021   Request for sterilization 03/04/2021   Uterine fibroids affecting pregnancy, antepartum 03/01/2021   History of abnormal cervical Pap smear 11/26/2020   History of herpes genitalis 11/26/2020   Iron deficiency anemia 11/26/2020   Previous cesarean delivery affecting pregnancy, antepartum 11/26/2020   History of preterm delivery, currently pregnant in second trimester 11/26/2020   Supervision of high risk pregnancy in second trimester 11/26/2020    History reviewed. No pertinent surgical history.  OB History     Gravida  1   Para      Term      Preterm      AB      Living         SAB      IAB      Ectopic      Multiple      Live Births               Home Medications    Prior to Admission medications   Medication Sig Start Date End Date Taking? Authorizing Provider  amoxicillin -clavulanate (AUGMENTIN ) 875-125 MG tablet Take 1 tablet by mouth every 12 (twelve) hours. 04/14/23  Yes Elic Vencill R, NP  cyclobenzaprine (FLEXERIL) 10 MG tablet Take 5-10 mg by mouth at bedtime as needed. Patient not taking: Reported on 04/14/2023 08/16/21   [provider]  fluconazole  (DIFLUCAN ) 150 MG tablet Take 150 mg by mouth once. Patient not taking: Reported on 04/14/2023 01/06/22   [provider]  fluticasone  (FLONASE ) 50 MCG/ACT nasal spray Place 2 sprays into both nostrils daily. 11/12/21   Linda Elsie BROCKS,  PA-C  HYDROXYprogesterone caproate [facility]) autoinjector Inject into the skin. Patient not taking: Reported on 04/14/2023 01/25/21   [provider]  ibuprofen  (ADVIL ) 600 MG tablet Take 1 tablet (600 mg total) by mouth every 6 (six) hours as needed. 11/08/21   Corlis Burnard DEL, NP  lidocaine  (XYLOCAINE ) 2 % solution Use as directed 15 mLs in the mouth or throat as needed for mouth pain. Patient not taking: Reported on 04/14/2023 11/08/21   Corlis Burnard DEL, NP  meloxicam (MOBIC) 15 MG tablet Take 15 mg by mouth daily. 08/16/21   [provider]  methylPREDNISolone  (MEDROL  DOSEPAK) 4 MG TBPK tablet Take as directed on package Patient not taking: Reported on 04/14/2023 12/11/21   Immordino, Garnette, FNP  metroNIDAZOLE  (FLAGYL ) 500 MG tablet Take 1 tablet (500 mg total) by mouth 2 (two) times daily. Patient not taking: Reported on 04/14/2023 12/13/21   Blaise Aleene KIDD, MD  naproxen  (NAPROSYN ) 500 MG tablet Take 500 mg by mouth 2 (two) times daily. 01/06/22   [provider]  oxyCODONE -acetaminophen  (PERCOCET/ROXICET) 5-325 MG tablet Take 1 tablet by mouth every 6 (six) hours as needed for severe pain. Patient not taking: Reported on 04/14/2023 10/05/22   Cuthriell, Jonathan D, PA-C  triamcinolone ointment (KENALOG) 0.1 % SMARTSIG:sparingly Topical Twice Daily 11/15/21  [provider]  trimethoprim -polymyxin b  (POLYTRIM ) ophthalmic solution Place 1 drop into the left eye every 6 (six) hours. Patient not taking: Reported on 04/14/2023 11/10/21   Lavell Bari LABOR, FNP  valACYclovir (VALTREX) 500 MG tablet Take 500 mg by mouth 2 (two) times daily as needed.    [provider]    Family History Family History  Problem Relation Age of Onset   Healthy Mother    Healthy Father     Social History Social History   Tobacco Use   Smoking status: Never   Smokeless tobacco: Never  Substance Use Topics   Alcohol use: Yes     Allergies   Tramadol   Review of  Systems Review of Systems   Physical Exam Triage Vital Signs ED Triage Vitals  Encounter Vitals Group     BP 04/14/23 1824 135/67     Systolic BP Percentile --      Diastolic BP Percentile --      Pulse Rate 04/14/23 1824 67     Resp 04/14/23 1824 17     Temp 04/14/23 1824 98 F (36.7 C)     Temp src --      SpO2 04/14/23 1824 100 %     Weight --      Height --      Head Circumference --      Peak Flow --      Pain Score 04/14/23 1821 0     Pain Loc --      Pain Education --      Exclude from Growth Chart --    No data found.  Updated Vital Signs BP 135/67   Pulse 67   Temp 98 F (36.7 C)   Resp 17   LMP 04/07/2023   SpO2 100%   Visual Acuity Right Eye Distance:   Left Eye Distance:   Bilateral Distance:    Right Eye Near:   Left Eye Near:    Bilateral Near:     Physical Exam Constitutional:      Appearance: Normal appearance.  HENT:     Right Ear: Hearing, tympanic membrane, ear canal and external ear normal.     Left Ear: Hearing, ear canal and external ear normal. Tympanic membrane is erythematous.  Eyes:     Extraocular Movements: Extraocular movements intact.  Pulmonary:     Effort: Pulmonary effort is normal.  Neurological:     Mental Status: She is alert and oriented to person, place, and time. Mental status is at baseline.      UC Treatments / Results  Labs (all labs ordered are listed, but only abnormal results are displayed) Labs Reviewed - No data to display  EKG   Radiology No results found.  Procedures Procedures (including critical care time)  Medications Ordered in UC Medications - No data to display  Initial Impression / Assessment and Plan / UC Course  I have reviewed the triage vital signs and the nursing notes.  Pertinent labs & imaging results that were available during my care of the patient were reviewed by me and considered in my medical decision making (see chart for details).  Nonrecurrent acute serous otitis  media of the left ear  Erythema to the tympanic membrane is consistent with infection, congestion to the nasal turbinates otherwise stable exam, prescribed augmentin  ,advised against ear cleaning, may use over-the-counter analgesics and warm compresses to the external ear for comfort, may follow-up if symptoms persist worsen or recur  Final Clinical Impressions(s) / UC Diagnoses   Final diagnoses:  Non-recurrent acute serous otitis media of left ear     Discharge Instructions      Today you are being treated for an infection of the eardrum  Take augmentin   twice daily for 7 days, you should begin to see improvement after 48 hours of medication use and then it should progressively get better  You may use Tylenol  or ibuprofen  for management of discomfort  May hold warm compresses to the ear for additional comfort  Please not attempted any ear cleaning or object or fluid placement into the ear canal to prevent further irritation    ED Prescriptions     Medication Sig Dispense Auth. Provider   amoxicillin -clavulanate (AUGMENTIN ) 875-125 MG tablet Take 1 tablet by mouth every 12 (twelve) hours. 14 tablet Reedy Biernat R, NP      PDMP not reviewed this encounter.   Teresa Shelba SAUNDERS, NP 04/14/23 [phone]

## 2023-04-14 NOTE — ED Triage Notes (Signed)
Patient to Urgent Care with complaints of left sided ear fullness/ pressure. Denies any fevers.  Reports symptoms started 1 week ago. Cold/ URI last week.

## 2023-10-10 ENCOUNTER — Other Ambulatory Visit: Payer: Self-pay

## 2023-10-10 ENCOUNTER — Emergency Department
Admission: EM | Admit: 2023-10-10 | Discharge: 2023-10-10 | Discharge status: Against Medical Advice | Payer: Self-pay | Attending: Emergency Medicine | Admitting: Emergency Medicine

## 2023-10-10 DIAGNOSIS — Z5321 Procedure and treatment not carried out due to patient leaving prior to being seen by health care provider: Secondary | ICD-10-CM | POA: Insufficient documentation

## 2023-10-10 DIAGNOSIS — R1030 Lower abdominal pain, unspecified: Secondary | ICD-10-CM | POA: Insufficient documentation

## 2023-10-10 LAB — CBC WITH DIFFERENTIAL/PLATELET
Abs Immature Granulocytes: 0.02 10*3/uL (ref 0.00–0.07)
Basophils Absolute: 0 10*3/uL (ref 0.0–0.1)
Basophils Relative: 0 %
Eosinophils Absolute: 0.1 10*3/uL (ref 0.0–0.5)
Eosinophils Relative: 1 %
HCT: 34.3 % — ABNORMAL LOW (ref 36.0–46.0)
Hemoglobin: 10.8 g/dL — ABNORMAL LOW (ref 12.0–15.0)
Immature Granulocytes: 0 %
Lymphocytes Relative: 16 %
Lymphs Abs: 1.1 10*3/uL (ref 0.7–4.0)
MCH: 26.2 pg (ref 26.0–34.0)
MCHC: 31.5 g/dL (ref 30.0–36.0)
MCV: 83.1 fL (ref 80.0–100.0)
Monocytes Absolute: 0.8 10*3/uL (ref 0.1–1.0)
Monocytes Relative: 13 %
Neutro Abs: 4.7 10*3/uL (ref 1.7–7.7)
Neutrophils Relative %: 70 %
Platelets: 283 10*3/uL (ref 150–400)
RBC: 4.13 MIL/uL (ref 3.87–5.11)
RDW: 13.1 % (ref 11.5–15.5)
WBC: 6.6 10*3/uL (ref 4.0–10.5)
nRBC: 0 % (ref 0.0–0.2)

## 2023-10-10 LAB — COMPREHENSIVE METABOLIC PANEL WITH GFR
ALT: 18 U/L (ref 0–44)
AST: 22 U/L (ref 15–41)
Albumin: 3.6 g/dL (ref 3.5–5.0)
Alkaline Phosphatase: 83 U/L (ref 38–126)
Anion gap: 8 (ref 5–15)
BUN: 13 mg/dL (ref 6–20)
CO2: 22 mmol/L (ref 22–32)
Calcium: 8.7 mg/dL — ABNORMAL LOW (ref 8.9–10.3)
Chloride: 103 mmol/L (ref 98–111)
Creatinine, Ser: 0.75 mg/dL (ref 0.44–1.00)
GFR, Estimated: >60 mL/min (ref >60)
Glucose, Bld: 102 mg/dL — ABNORMAL HIGH (ref 70–99)
Potassium: 3.6 mmol/L (ref 3.5–5.1)
Sodium: 133 mmol/L — ABNORMAL LOW (ref 135–145)
Total Bilirubin: 0.6 mg/dL (ref 0.0–1.2)
Total Protein: 7.6 g/dL (ref 6.5–8.1)

## 2023-10-10 LAB — URINALYSIS, ROUTINE W REFLEX MICROSCOPIC
Bilirubin Urine: NEGATIVE
Glucose, UA: NEGATIVE mg/dL
Hgb urine dipstick: NEGATIVE
Ketones, ur: NEGATIVE mg/dL
Leukocytes,Ua: NEGATIVE
Nitrite: NEGATIVE
Protein, ur: NEGATIVE mg/dL
Specific Gravity, Urine: 1.006 (ref 1.005–1.030)
pH: 6 (ref 5.0–8.0)

## 2023-10-10 LAB — LIPASE, BLOOD: Lipase: 42 U/L (ref 11–51)

## 2023-10-10 NOTE — ED Triage Notes (Signed)
 Patient C/O mid-lower abdominal pain that began this afternoon. Patient denies any diarrhea, vomiting, or urinary symptoms at this time.

## 2023-10-10 NOTE — ED Notes (Addendum)
 Observed pt being pushed out of lobby via wheelchair by visitor, no distress noted.

## 2023-10-10 NOTE — ED Triage Notes (Addendum)
 FIRST NURSE NOTE:  Pt arrived via [facility] from home, c/o abd pain since 2pm, worse since 10pm, lower abd pain, no hx of the same. VSS

## 2024-01-11 ENCOUNTER — Emergency Department (HOSPITAL_BASED_OUTPATIENT_CLINIC_OR_DEPARTMENT_OTHER)
Admission: EM | Admit: 2024-01-11 | Discharge: 2024-01-11 | Disposition: A | Discharge status: Home / Self Care | Payer: Self-pay

## 2024-01-11 ENCOUNTER — Emergency Department (HOSPITAL_BASED_OUTPATIENT_CLINIC_OR_DEPARTMENT_OTHER): Payer: Self-pay | Admitting: Radiology

## 2024-01-11 ENCOUNTER — Encounter (HOSPITAL_BASED_OUTPATIENT_CLINIC_OR_DEPARTMENT_OTHER): Payer: Self-pay | Admitting: Urology

## 2024-01-11 ENCOUNTER — Other Ambulatory Visit: Payer: Self-pay

## 2024-01-11 DIAGNOSIS — R519 Headache, unspecified: Secondary | ICD-10-CM | POA: Insufficient documentation

## 2024-01-11 DIAGNOSIS — W228XXA Striking against or struck by other objects, initial encounter: Secondary | ICD-10-CM | POA: Insufficient documentation

## 2024-01-11 DIAGNOSIS — S022XXA Fracture of nasal bones, initial encounter for closed fracture: Secondary | ICD-10-CM | POA: Insufficient documentation

## 2024-01-11 MED ORDER — KETOROLAC TROMETHAMINE 15 MG/ML IJ SOLN: 15.0000 mg | Freq: Once | INTRAMUSCULAR | Status: DC

## 2024-01-11 MED ORDER — METOCLOPRAMIDE HCL 10 MG PO TABS: 10.0000 mg | ORAL_TABLET | Freq: Four times a day (QID) | ORAL | 0 refills | Status: AC

## 2024-01-11 MED ORDER — NAPROXEN 500 MG PO TABS: 500.0000 mg | ORAL_TABLET | Freq: Two times a day (BID) | ORAL | 0 refills | Status: AC

## 2024-01-11 MED ORDER — KETOROLAC TROMETHAMINE 15 MG/ML IJ SOLN
15.0000 mg | Freq: Once | INTRAMUSCULAR | Status: AC
  Administered 2024-01-11: 15 mg via INTRAMUSCULAR
  Filled 2024-01-11: qty 1

## 2024-01-11 NOTE — ED Triage Notes (Signed)
 Head butted in nose by toddler last night  States pain to nose and swelling

## 2024-01-11 NOTE — ED Notes (Signed)
 Patient transported to X-ray

## 2024-01-11 NOTE — ED Notes (Signed)
 ED Provider at bedside.

## 2024-01-11 NOTE — ED Provider Notes (Signed)
 [place] [facility] AT [facility] Provider Note   CSN: [id number] Arrival date & time: 01/11/24  1821     Patient presents with: Facial Injury   Linda Hurst is a 38 y.o. female.   Facial Injury Patient is a 38 year old female presenting today for concerns for nose pain and swelling after her toddler had butted her nose yesterday night.  Note that she has no difficulty breathing from both naris but has persistent pain.  Has not noted any blood.  Reports a mild headache at this time similar to her migraines that she normally has.  Left-sided, pulsatile, with light and sound sensitivity.  Denies blurry vision, vertigo, vomiting, chest pain, shortness breath, abdominal pain.    Prior to Admission medications   Medication Sig Start Date End Date Taking? Authorizing Provider  metoCLOPramide (REGLAN) 10 MG tablet Take 1 tablet (10 mg total) by mouth every 6 (six) hours. 01/11/24  Yes Beola Terrall RAMAN, PA-C  naproxen (NAPROSYN) 500 MG tablet Take 1 tablet (500 mg total) by mouth 2 (two) times daily. 01/11/24  Yes Amaury Kuzel S, PA-C  amoxicillin -clavulanate (AUGMENTIN ) 875-125 MG tablet Take 1 tablet by mouth every 12 (twelve) hours. 04/14/23   White, Shelba SAUNDERS, NP  cyclobenzaprine (FLEXERIL) 10 MG tablet Take 5-10 mg by mouth at bedtime as needed. Patient not taking: Reported on 04/14/2023 08/16/21   [provider]  fluconazole  (DIFLUCAN ) 150 MG tablet Take 150 mg by mouth once. Patient not taking: Reported on 04/14/2023 01/06/22   [provider]  fluticasone  (FLONASE ) 50 MCG/ACT nasal spray Place 2 sprays into both nostrils daily. 11/12/21   Gladis Elsie BROCKS, PA-C  HYDROXYprogesterone caproate [facility]) autoinjector Inject into the skin. Patient not taking: Reported on 04/14/2023 01/25/21   [provider]  ibuprofen  (ADVIL ) 600 MG tablet Take 1 tablet (600 mg total) by mouth every 6 (six) hours as needed. 11/08/21   Corlis Burnard DEL, NP   lidocaine  (XYLOCAINE ) 2 % solution Use as directed 15 mLs in the mouth or throat as needed for mouth pain. Patient not taking: Reported on 04/14/2023 11/08/21   Corlis Burnard DEL, NP  meloxicam (MOBIC) 15 MG tablet Take 15 mg by mouth daily. 08/16/21   [provider]  methylPREDNISolone  (MEDROL  DOSEPAK) 4 MG TBPK tablet Take as directed on package Patient not taking: Reported on 04/14/2023 12/11/21   Immordino, Garnette, FNP  metroNIDAZOLE  (FLAGYL ) 500 MG tablet Take 1 tablet (500 mg total) by mouth 2 (two) times daily. Patient not taking: Reported on 04/14/2023 12/13/21   Blaise Aleene KIDD, MD  naproxen (NAPROSYN) 500 MG tablet Take 500 mg by mouth 2 (two) times daily. 01/06/22   [provider]  oxyCODONE -acetaminophen  (PERCOCET/ROXICET) 5-325 MG tablet Take 1 tablet by mouth every 6 (six) hours as needed for severe pain. Patient not taking: Reported on 04/14/2023 10/05/22   Cuthriell, Jonathan D, PA-C  triamcinolone ointment (KENALOG) 0.1 % SMARTSIG:sparingly Topical Twice Daily 11/15/21   [provider]  trimethoprim -polymyxin b  (POLYTRIM ) ophthalmic solution Place 1 drop into the left eye every 6 (six) hours. Patient not taking: Reported on 04/14/2023 11/10/21   Lavell Bari LABOR, FNP  valACYclovir (VALTREX) 500 MG tablet Take 500 mg by mouth 2 (two) times daily as needed.    [provider]    Allergies: Tramadol    Review of Systems  HENT:         Nose injury  All other systems reviewed and are negative.   Updated Vital Signs  BP (!) 151/103   Pulse 84   Temp 98 F (36.7 C) (Oral)   Resp 18   Ht 5' 4 (1.626 m)   Wt 75.8 kg   SpO2 100%   BMI 28.68 kg/m   Physical Exam Vitals and nursing note reviewed.  Constitutional:      General: She is not in acute distress.    Appearance: Normal appearance. She is not ill-appearing or diaphoretic.  HENT:     Head: Normocephalic.     Nose:     Comments: Noted to have mild swelling over nasal bone.  With no  septal hematoma noted in either nare bilaterally.    Mouth/Throat:     Mouth: Mucous membranes are moist.     Pharynx: Oropharynx is clear.  Eyes:     General: No scleral icterus.       Right eye: No discharge.        Left eye: No discharge.     Extraocular Movements: Extraocular movements intact.     Conjunctiva/sclera: Conjunctivae normal.  Cardiovascular:     Rate and Rhythm: Normal rate and regular rhythm.     Pulses: Normal pulses.     Heart sounds: Normal heart sounds. No murmur heard.    No friction rub. No gallop.  Pulmonary:     Effort: Pulmonary effort is normal. No respiratory distress.     Breath sounds: No stridor. No wheezing, rhonchi or rales.  Chest:     Chest wall: No tenderness.  Abdominal:     General: Abdomen is flat. There is no distension.     Palpations: Abdomen is soft.     Tenderness: There is no abdominal tenderness. There is no right CVA tenderness, left CVA tenderness, guarding or rebound.  Musculoskeletal:        General: No swelling, deformity or signs of injury.     Cervical back: Normal range of motion. No rigidity.     Right lower leg: No edema.     Left lower leg: No edema.  Skin:    General: Skin is warm and dry.     Findings: No bruising, erythema or lesion.  Neurological:     General: No focal deficit present.     Mental Status: She is alert and oriented to person, place, and time. Mental status is at baseline.     Sensory: No sensory deficit.     Motor: No weakness.  Psychiatric:        Mood and Affect: Mood normal.     (all labs ordered are listed, but only abnormal results are displayed) Labs Reviewed - No data to display  EKG: None  Radiology: DG Nasal Bones Result Date: 01/11/2024 CLINICAL DATA:  Trauma to the nose. EXAM: NASAL BONES - 3+ VIEW COMPARISON:  None Available. FINDINGS: Tiny bone fragment at the tip of the nasal bone may represent a small fracture. Linear lucency through the nasal bone may represent fracture or  sutures. CT may provide better evaluation. Mild soft tissue swelling over the nose. IMPRESSION: Possible nondisplaced nasal bone fracture. CT may provide better evaluation. Electronically Signed   By: Vanetta Chou M.D.   On: 01/11/2024 19:56    Procedures   Medications Ordered in the ED  ketorolac (TORADOL) 15 MG/ML injection 15 mg (15 mg Intramuscular Given 01/11/24 2053)    Medical Decision Making Amount and/or Complexity of Data Reviewed Radiology: ordered.  Risk Prescription drug management.   This patient is a 38 year old female who presents  to the ED for concern of nasal bone injury from trauma who hit her nose with back of his head.  On physical exam, patient is in no acute distress, afebrile, alert and orient x 4, speaking in full sentences, nontachypneic, nontachycardic.  Notably has some mild swelling tenderness to nasal bridge.  No septal hematomas noted.  No other injuries otherwise.  X-ray shows suspected possible nondisplaced fracture.  With patient's current presentation, and no hematoma, will have symptomatic management here.  She has used mild headache here.  And elected to do Toradol shot and naproxen.  With this headache being similar to her previous migraine headaches she has had in the past.  Will have her follow-up with ENT as needed.  Patient vital signs have remained stable throughout the course of patient's time in the ED. Low suspicion for any other emergent pathology at this time. I believe this patient is safe to be discharged. Provided strict return to ER precautions. Patient expressed agreement and understanding of plan. All questions were answered.  Differential diagnoses prior to evaluation: The emergent differential diagnosis includes, but is not limited to, fracture, ligamentous injury, neurovascular injury, dislocation, malalignment, septal hematoma. This is not an exhaustive differential.   Past Medical History / Co-morbidities / Social  History: No chronic past medical history  Additional history: Chart reviewed.   Lab Tests/Imaging studies: I personally interpreted labs/imaging and the pertinent results include:   X-ray nasal bones shows possible nondisplaced fracture. I agree with the radiologist interpretation.  Medications: I ordered medication including Toradol, naproxen, Reglan.  I have reviewed the patients home medicines and have made adjustments as needed.  Critical Interventions: None  Social Determinants of Health: None  Disposition: After consideration of the diagnostic results and the patients response to treatment, I feel that the patient would benefit from discharge and treatment as above.   emergency department workup does not suggest an emergent condition requiring admission or immediate intervention beyond what has been performed at this time. The plan is: Follow-up with ENT as needed, symptomatic management at home. The patient is safe for discharge and has been instructed to return immediately for worsening symptoms, change in symptoms or any other concerns.   Final diagnoses:  Closed fracture of nasal bone, initial encounter    ED Discharge Orders          Ordered    naproxen (NAPROSYN) 500 MG tablet  2 times daily        01/11/24 2034    metoCLOPramide (REGLAN) 10 MG tablet  Every 6 hours        01/11/24 2034               Beola Terrall RAMAN, PA-C 01/11/24 2101    Neysa Caron PARAS, DO 01/12/24 0010

## 2024-01-11 NOTE — Discharge Instructions (Addendum)
 You were seen today for nasal bone fracture.  X-ray suggested possible fracture today that is nondisplaced.  Will recommend that you continue to follow-up with ENT if pain persists over the course of the next few weeks.  Sent in some pain medication for you to use.  You can use this with Tylenol  for additional relief.  In the interim I have also sent in some nausea and headache medication as well.  Please take Naprosyn, 500mg  by mouth twice daily as needed for pain - this in an antiinflammatory medicine (NSAID) and is similar to ibuprofen  - many people feel that it is stronger than ibuprofen  and it is easier to take since it is a smaller pill.  Please use this only for 1 week - if your pain persists, you will need to follow up with your doctor in the office for ongoing guidance and pain control.   Take Tylenol  (acetominophen)  650mg  every 4-6 hours, as needed for pain or fever. Do not take more than 4,000 mg in a 24-hour period. As this may cause liver damage. While this is rare, if you begin to develop yellowing of the skin or eyes, stop taking and return to ER immediately.  Recommend that you ice the area to help reduce swelling.

## 2024-01-31 ENCOUNTER — Ambulatory Visit
Admission: EM | Admit: 2024-01-31 | Discharge: 2024-01-31 | Disposition: A | Discharge status: Home / Self Care | Payer: Self-pay | Attending: Emergency Medicine | Admitting: Emergency Medicine

## 2024-01-31 DIAGNOSIS — J069 Acute upper respiratory infection, unspecified: Secondary | ICD-10-CM

## 2024-01-31 LAB — POC SOFIA SARS ANTIGEN FIA: SARS Coronavirus 2 Ag: NEGATIVE

## 2024-01-31 LAB — POCT RAPID STREP A (OFFICE): Rapid Strep A Screen: NEGATIVE

## 2024-01-31 NOTE — ED Provider Notes (Signed)
 Linda Hurst    CSN: [id number] Arrival date & time: 01/31/24  0930      History   Chief Complaint Chief Complaint  Patient presents with   Cough   Nasal Congestion    HPI Linda Hurst is a 38 y.o. female.  Patient presents with 3-day history of bodyaches, congestion, sore throat, cough.  No fever, shortness of breath, vomiting, diarrhea.  Treatment attempted with OTC sinus medication.  The history is provided by the patient and medical records.    History reviewed. No pertinent past medical history.  Patient Active Problem List   Diagnosis Date Noted   Nasal congestion 12/11/2021   Request for sterilization 03/04/2021   Uterine fibroids affecting pregnancy, antepartum 03/01/2021   History of abnormal cervical Pap smear 11/26/2020   History of herpes genitalis 11/26/2020   Iron deficiency anemia 11/26/2020   Previous cesarean delivery affecting pregnancy, antepartum 11/26/2020   History of preterm delivery, currently pregnant in second trimester 11/26/2020   Supervision of high risk pregnancy in second trimester 11/26/2020    History reviewed. No pertinent surgical history.  OB History     Gravida  1   Para      Term      Preterm      AB      Living         SAB      IAB      Ectopic      Multiple      Live Births               Home Medications    Prior to Admission medications   Medication Sig Start Date End Date Taking? Authorizing Provider  amoxicillin -clavulanate (AUGMENTIN ) 875-125 MG tablet Take 1 tablet by mouth every 12 (twelve) hours. Patient not taking: Reported on 01/31/2024 04/14/23   Teresa Shelba SAUNDERS, NP  cyclobenzaprine (FLEXERIL) 10 MG tablet Take 5-10 mg by mouth at bedtime as needed. Patient not taking: Reported on 04/14/2023 08/16/21   [provider]  fluconazole  (DIFLUCAN ) 150 MG tablet Take 150 mg by mouth once. Patient not taking: Reported on 04/14/2023 01/06/22   [provider]   fluticasone  (FLONASE ) 50 MCG/ACT nasal spray Place 2 sprays into both nostrils daily. 11/12/21   Gladis Elsie BROCKS, PA-C  HYDROXYprogesterone caproate [facility]) autoinjector Inject into the skin. Patient not taking: Reported on 04/14/2023 01/25/21   [provider]  ibuprofen  (ADVIL ) 600 MG tablet Take 1 tablet (600 mg total) by mouth every 6 (six) hours as needed. 11/08/21   Corlis Burnard DEL, NP  lidocaine  (XYLOCAINE ) 2 % solution Use as directed 15 mLs in the mouth or throat as needed for mouth pain. Patient not taking: Reported on 04/14/2023 11/08/21   Corlis Burnard DEL, NP  meloxicam (MOBIC) 15 MG tablet Take 15 mg by mouth daily. 08/16/21   [provider]  methylPREDNISolone  (MEDROL  DOSEPAK) 4 MG TBPK tablet Take as directed on package Patient not taking: Reported on 04/14/2023 12/11/21   Immordino, Garnette, FNP  metoCLOPramide (REGLAN) 10 MG tablet Take 1 tablet (10 mg total) by mouth every 6 (six) hours. 01/11/24   Beola Terrall RAMAN, PA-C  metroNIDAZOLE  (FLAGYL ) 500 MG tablet Take 1 tablet (500 mg total) by mouth 2 (two) times daily. Patient not taking: Reported on 04/14/2023 12/13/21   Blaise Aleene KIDD, MD  naproxen (NAPROSYN) 500 MG tablet Take 500 mg by mouth 2 (two) times daily. 01/06/22   [provider]  naproxen (NAPROSYN)  500 MG tablet Take 1 tablet (500 mg total) by mouth 2 (two) times daily. 01/11/24   Bauer, Collin S, PA-C  oxyCODONE -acetaminophen  (PERCOCET/ROXICET) 5-325 MG tablet Take 1 tablet by mouth every 6 (six) hours as needed for severe pain. Patient not taking: Reported on 04/14/2023 10/05/22   Cuthriell, Jonathan D, PA-C  triamcinolone ointment (KENALOG) 0.1 % SMARTSIG:sparingly Topical Twice Daily 11/15/21   [provider]  trimethoprim -polymyxin b  (POLYTRIM ) ophthalmic solution Place 1 drop into the left eye every 6 (six) hours. Patient not taking: Reported on 04/14/2023 11/10/21   Lavell Bari LABOR, FNP  valACYclovir (VALTREX) 500 MG tablet Take 500 mg  by mouth 2 (two) times daily as needed.    [provider]    Family History Family History  Problem Relation Age of Onset   Healthy Mother    Healthy Father     Social History Social History   Tobacco Use   Smoking status: Never   Smokeless tobacco: Never  Substance Use Topics   Alcohol use: Yes     Allergies   Tramadol   Review of Systems Review of Systems  Constitutional:  Negative for chills and fever.  HENT:  Positive for congestion, postnasal drip, rhinorrhea and sore throat.   Respiratory:  Positive for cough. Negative for shortness of breath.   Gastrointestinal:  Negative for diarrhea and vomiting.     Physical Exam Triage Vital Signs ED Triage Vitals  Encounter Vitals Group     BP 01/31/24 0954 123/75     Girls Systolic BP Percentile --      Girls Diastolic BP Percentile --      Boys Systolic BP Percentile --      Boys Diastolic BP Percentile --      Pulse Rate 01/31/24 0954 96     Resp 01/31/24 0954 18     Temp 01/31/24 0954 98.4 F (36.9 C)     Temp src --      SpO2 01/31/24 0954 98 %     Weight --      Height --      Head Circumference --      Peak Flow --      Pain Score 01/31/24 0955 5     Pain Loc --      Pain Education --      Exclude from Growth Chart --    No data found.  Updated Vital Signs BP 123/75   Pulse 96   Temp 98.4 F (36.9 C)   Resp 18   LMP 01/24/2024   SpO2 98%   Visual Acuity Right Eye Distance:   Left Eye Distance:   Bilateral Distance:    Right Eye Near:   Left Eye Near:    Bilateral Near:     Physical Exam Constitutional:      General: She is not in acute distress. HENT:     Right Ear: Tympanic membrane normal.     Left Ear: Tympanic membrane normal.     Nose: Rhinorrhea present.     Mouth/Throat:     Mouth: Mucous membranes are moist.     Pharynx: Oropharynx is clear.  Cardiovascular:     Rate and Rhythm: Normal rate and regular rhythm.     Heart sounds: Normal heart sounds.   Pulmonary:     Effort: Pulmonary effort is normal. No respiratory distress.     Breath sounds: Normal breath sounds.  Neurological:     Mental Status: She is alert.  UC Treatments / Results  Labs (all labs ordered are listed, but only abnormal results are displayed) Labs Reviewed  POCT RAPID STREP A (OFFICE)  POC SOFIA SARS ANTIGEN FIA    EKG   Radiology No results found.  Procedures Procedures (including critical care time)  Medications Ordered in UC Medications - No data to display  Initial Impression / Assessment and Plan / UC Course  I have reviewed the triage vital signs and the nursing notes.  Pertinent labs & imaging results that were available during my care of the patient were reviewed by me and considered in my medical decision making (see chart for details).    Viral URI. Rapid COVID and strep negative.  Discussed symptomatic treatment including Tylenol  or ibuprofen  as needed for fever or discomfort, plain Mucinex as needed for congestion, rest, hydration.  Instructed patient to follow-up with her PCP if not improving.  ED precautions given.  Patient agrees to plan of care.    Final Clinical Impressions(s) / UC Diagnoses   Final diagnoses:  Viral URI     Discharge Instructions      The COVID and strep tests are negative.   Take Tylenol  or ibuprofen  as needed for fever or discomfort.  Take plain Mucinex as needed for congestion.  Rest and keep yourself hydrated.    Follow-up with your primary care provider if your symptoms are not improving.         ED Prescriptions   None    PDMP not reviewed this encounter.   Corlis Burnard DEL, NP 01/31/24 1019

## 2024-01-31 NOTE — ED Triage Notes (Signed)
 Patient to Urgent Care with complaints of nasal congestion/ cough/ sore throat/ body aches. No fevers.  Symptoms x3 days.   Meds: mucinex sinus

## 2024-01-31 NOTE — Discharge Instructions (Addendum)
 The COVID and strep tests are negative.   Take Tylenol  or ibuprofen as needed for fever or discomfort.  Take plain Mucinex as needed for congestion.  Rest and keep yourself hydrated.    Follow-up with your primary care provider if your symptoms are not improving.
